# Patient Record
Sex: Male | Born: 1950 | Race: White | Hispanic: No | Smoking: Current every day smoker
Health system: Southern US, Community
[De-identification: ages and names within clinical notes are randomized; demographics above are authoritative.]

## PROBLEM LIST (undated history)

## (undated) DIAGNOSIS — E785 Hyperlipidemia, unspecified: Secondary | ICD-10-CM

## (undated) DIAGNOSIS — L02419 Cutaneous abscess of limb, unspecified: Secondary | ICD-10-CM

## (undated) DIAGNOSIS — N183 Chronic kidney disease, stage 3 unspecified: Secondary | ICD-10-CM

## (undated) DIAGNOSIS — R0609 Other forms of dyspnea: Secondary | ICD-10-CM

## (undated) DIAGNOSIS — L03119 Cellulitis of unspecified part of limb: Secondary | ICD-10-CM

## (undated) DIAGNOSIS — R778 Other specified abnormalities of plasma proteins: Secondary | ICD-10-CM

## (undated) DIAGNOSIS — I10 Essential (primary) hypertension: Secondary | ICD-10-CM

## (undated) DIAGNOSIS — M1990 Unspecified osteoarthritis, unspecified site: Secondary | ICD-10-CM

## (undated) DIAGNOSIS — R7989 Other specified abnormal findings of blood chemistry: Secondary | ICD-10-CM

## (undated) HISTORY — DX: Other specified abnormalities of plasma proteins: R77.8

## (undated) HISTORY — DX: Other specified abnormal findings of blood chemistry: R79.89

## (undated) HISTORY — PX: ANKLE FRACTURE SURGERY: SHX122

---

## 2000-01-22 ENCOUNTER — Emergency Department (HOSPITAL_COMMUNITY): Admission: EM | Admit: 2000-01-22 | Discharge: 2000-01-23 | Payer: Self-pay | Admitting: Emergency Medicine

## 2002-05-19 ENCOUNTER — Encounter: Payer: Self-pay | Admitting: Orthopedic Surgery

## 2002-05-19 ENCOUNTER — Inpatient Hospital Stay (HOSPITAL_COMMUNITY): Admission: EM | Admit: 2002-05-19 | Discharge: 2002-05-21 | Payer: Self-pay | Admitting: Emergency Medicine

## 2002-05-19 ENCOUNTER — Encounter: Payer: Self-pay | Admitting: Emergency Medicine

## 2002-06-06 ENCOUNTER — Ambulatory Visit (HOSPITAL_BASED_OUTPATIENT_CLINIC_OR_DEPARTMENT_OTHER): Admission: RE | Admit: 2002-06-06 | Discharge: 2002-06-07 | Payer: Self-pay | Admitting: Orthopedic Surgery

## 2006-04-10 ENCOUNTER — Encounter: Admission: RE | Admit: 2006-04-10 | Discharge: 2006-04-10 | Payer: Self-pay | Admitting: Orthopedic Surgery

## 2006-04-12 ENCOUNTER — Emergency Department (HOSPITAL_COMMUNITY): Admission: EM | Admit: 2006-04-12 | Discharge: 2006-04-12 | Payer: Self-pay | Admitting: Emergency Medicine

## 2009-10-28 ENCOUNTER — Emergency Department (HOSPITAL_COMMUNITY): Admission: EM | Admit: 2009-10-28 | Discharge: 2009-10-29 | Payer: Self-pay | Admitting: Emergency Medicine
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
## 2011-12-25 ENCOUNTER — Emergency Department (HOSPITAL_BASED_OUTPATIENT_CLINIC_OR_DEPARTMENT_OTHER)
Admission: EM | Admit: 2011-12-25 | Discharge: 2011-12-25 | Disposition: A | Discharge status: Home / Self Care | Payer: Self-pay | Attending: Emergency Medicine | Admitting: Emergency Medicine

## 2011-12-25 ENCOUNTER — Encounter (HOSPITAL_BASED_OUTPATIENT_CLINIC_OR_DEPARTMENT_OTHER): Payer: Self-pay

## 2011-12-25 DIAGNOSIS — F17200 Nicotine dependence, unspecified, uncomplicated: Secondary | ICD-10-CM | POA: Insufficient documentation

## 2011-12-25 DIAGNOSIS — N289 Disorder of kidney and ureter, unspecified: Secondary | ICD-10-CM | POA: Insufficient documentation

## 2011-12-25 DIAGNOSIS — I10 Essential (primary) hypertension: Secondary | ICD-10-CM | POA: Insufficient documentation

## 2011-12-25 DIAGNOSIS — E871 Hypo-osmolality and hyponatremia: Secondary | ICD-10-CM | POA: Insufficient documentation

## 2011-12-25 DIAGNOSIS — E119 Type 2 diabetes mellitus without complications: Secondary | ICD-10-CM | POA: Insufficient documentation

## 2011-12-25 HISTORY — DX: Essential (primary) hypertension: I10

## 2011-12-25 LAB — BASIC METABOLIC PANEL
BUN: 29 mg/dL — ABNORMAL HIGH (ref 6–23)
CO2: 25 mEq/L (ref 19–32)
Calcium: 8.6 mg/dL (ref 8.4–10.5)
Glucose, Bld: 92 mg/dL (ref 70–99)
Sodium: 120 mEq/L — ABNORMAL LOW (ref 135–145)

## 2011-12-25 LAB — URINALYSIS, ROUTINE W REFLEX MICROSCOPIC
Bilirubin Urine: NEGATIVE
Glucose, UA: NEGATIVE mg/dL
Ketones, ur: NEGATIVE mg/dL
pH: 5.5 (ref 5.0–8.0)

## 2011-12-25 LAB — CBC
MCH: 30.8 pg (ref 26.0–34.0)
MCHC: 36.1 g/dL — ABNORMAL HIGH (ref 30.0–36.0)
MCV: 85.4 fL (ref 78.0–100.0)
Platelets: 287 10*3/uL (ref 150–400)
RBC: 3.28 MIL/uL — ABNORMAL LOW (ref 4.22–5.81)

## 2011-12-25 NOTE — ED Notes (Signed)
Urinal provided.

## 2011-12-25 NOTE — Discharge Instructions (Signed)
Hyponatremia   Hyponatremia is when the amount of salt (sodium) in your blood is too low. When sodium levels are low, your cells will absorb extra water and swell. The swelling happens throughout the body, but it mostly affects the brain. Severe brain swelling (cerebral edema), seizures, or coma can happen.   CAUSES   Heart, kidney, or liver problems.   Thyroid problems.   Adrenal gland problems.   Severe vomiting and diarrhea.   Certain medicines or illegal drugs.   Dehydration.   Drinking too much water.   Low-sodium diet.   SYMPTOMS   Nausea and vomiting.   Confusion.   Lethargy.   Agitation.   Headache.   Twitching or shaking (seizures).   Unconsciousness.   Appetite loss.   Muscle weakness and cramping.   DIAGNOSIS   Hyponatremia is identified by a simple blood test. Your caregiver will perform a history and physical exam to try to find the cause and type of hyponatremia. Other tests may be needed to measure the amount of sodium in your blood and urine.   TREATMENT   Treatment will depend on the cause.   Fluids may be given through the vein (IV).   Medicines may be used to correct the sodium imbalance. If medicines are causing the problem, they will need to be adjusted.   Water or fluid intake may be restricted to restore proper balance.   The speed of correcting the sodium problem is very important. If the problem is corrected too fast, nerve damage (sometimes unchangeable) can happen.   HOME CARE INSTRUCTIONS   Only take medicines as directed by your caregiver. Many medicines can make hyponatremia worse. Discuss all your medicines with your caregiver.   Carefully follow any recommended diet, including any fluid restrictions.   You may be asked to repeat lab tests. Follow these directions.   Avoid alcohol and recreational drugs.   SEEK MEDICAL CARE IF:   You develop worsening nausea, fatigue, headache, confusion, or weakness.   Your original hyponatremia symptoms return.   You have problems following the  recommended diet.   SEEK IMMEDIATE MEDICAL CARE IF:   You have a seizure.   You faint.   You have ongoing diarrhea or vomiting.   MAKE SURE YOU:   Understand these instructions.   Will watch your condition.   Will get help right away if you are not doing well or get worse.   Document Released: 08/14/2002 Document Revised: 08/13/2011 Document Reviewed: 02/08/2011   [employer]® Patient Information ©2012 [employer].

## 2011-12-25 NOTE — ED Notes (Signed)
Secondary Assessment-  Pt reports he was seen by PMD earlier in the week and had labs collected.  He was called by office and informed NA was 119 and to go to ED for further evaluation.  Pt denies any symptoms and states he feels fine.

## 2011-12-25 NOTE — ED Notes (Signed)
Pt states that he had blood drawn by pcp and was told to come directly to ED because his sodium level was 119.

## 2011-12-25 NOTE — ED Provider Notes (Signed)
History     CSN: [id number]  Arrival date & time 12/25/11  31   First MD Initiated Contact with Patient 12/25/11 1815      Chief Complaint  Patient presents with  . Hyponatremia     (Consider location/radiation/quality/duration/timing/severity/associated sxs/prior treatment) HPI Comments: Pt was sent in by pcp for hyponatremia of 119 that pt had drawn a couple of days ago:pt states that he is not having an symptoms:pt states that he had eliminated sodium from his diet about 1 year ago:pt states that his bp has been better managed:pt denies dizziness, confusion, visual changes or other symptoms  The history is provided by the patient. No language interpreter was used.    Past Medical History  Diagnosis Date  . Hypertension   . Diabetes mellitus     Past Surgical History  Procedure Date  . Ankle fracture surgery     History reviewed. No pertinent family history.  History  Substance Use Topics  . Smoking status: Current Everyday Smoker -- 1.5 packs/day for 30 years    Types: Cigarettes  . Smokeless tobacco: Not on file  . Alcohol Use: No      Review of Systems  Constitutional: Negative.   Respiratory: Negative.   Cardiovascular: Negative.   Neurological: Negative.     Allergies  Aleve  Home Medications   Current Outpatient Rx  Name Route Sig Dispense Refill  . AMOXICILLIN 500 MG PO TABS Oral Take 500 mg by mouth 2 (two) times daily.    Marland Kitchen CLONAZEPAM 0.5 MG PO TABS Oral Take 0.5 mg by mouth 2 (two) times daily as needed.    . OMEGA-3 FATTY ACIDS 1000 MG PO CAPS Oral Take 2 g by mouth daily.    Marland Kitchen HYDROCHLOROTHIAZIDE 25 MG PO TABS Oral Take 25 mg by mouth daily.    Marland Kitchen LISINOPRIL 20 MG PO TABS Oral Take 20 mg by mouth daily.    . MELOXICAM 15 MG PO TABS Oral Take 15 mg by mouth daily.    Marland Kitchen METFORMIN HCL 500 MG PO TABS Oral Take 500 mg by mouth 2 (two) times daily with a meal.    . METOPROLOL SUCCINATE ER 100 MG PO TB24 Oral Take 100 mg by mouth daily. Take  with or immediately following a meal.    . ONE-DAILY MULTI VITAMINS PO TABS Oral Take 1 tablet by mouth daily.    Marland Kitchen PAROXETINE HCL 20 MG PO TABS Oral Take 20 mg by mouth every morning.    Marland Kitchen RANITIDINE HCL PO Oral Take 1 tablet by mouth daily as needed. Patient uses this medication for his allergies.      BP 148/75  Pulse 70  Temp(Src) 98.4 F (36.9 C) (Oral)  Resp 16  Ht 5\' 9"  (1.753 m)  Wt 214 lb (97.07 kg)  BMI 31.60 kg/m2  SpO2 100%  Physical Exam  Vitals reviewed. Constitutional: He is oriented to person, place, and time. He appears well-developed and well-nourished.  HENT:  Head: Normocephalic and atraumatic.  Eyes: Conjunctivae and EOM are normal.  Neck: Neck supple.  Cardiovascular: Normal rate and regular rhythm.   Pulmonary/Chest: Effort normal and breath sounds normal.  Musculoskeletal: Normal range of motion.  Neurological: He is alert and oriented to person, place, and time.  Skin: Skin is warm and dry.  Psychiatric: He has a normal mood and affect.    ED Course  Procedures (including critical care time)  Labs Reviewed  BASIC METABOLIC PANEL - Abnormal; Notable for the  following:    Sodium 120 (*)    Chloride 84 (*)    BUN 29 (*)    Creatinine, Ser 2.50 (*)    GFR calc non Af Amer 26 (*)    GFR calc Af Amer 31 (*)    All other components within normal limits  CBC - Abnormal; Notable for the following:    RBC 3.28 (*)    Hemoglobin 10.1 (*)    HCT 28.0 (*)    MCHC 36.1 (*)    All other components within normal limits  URINALYSIS, ROUTINE W REFLEX MICROSCOPIC   No results found.   1. Hyponatremia   2. Renal insufficiency       MDM  Discussed not eliminating sodium with pt and to limit his fluid intake and he is to follow up to have labs drawn in a week:Dr. Monia Pouch involved in the care of the pt        Glendell Docker, NP 12/25/11 1946

## 2011-12-26 NOTE — ED Provider Notes (Signed)
Medical screening examination/treatment/procedure(s) were conducted as a shared visit with non-physician practitioner(s) and myself.  I personally evaluated the patient during the encounter Pt with longstanding hypertension had been trying to control his hypertension by limiting sodium intake.  He had low sodium at his physician's office today and was sent to [facility] ED for evaluation.  He says that once or twice in the day his blood pressure will fall into the [id number] systolic and he will rest for an hour or two and recover; otherwise he is asymptomatic.  Exam shows him awake and alert, heart and lung clear, neurologically intact.  Lab tests show low sodium of 120 and mild renal insufficiency.  Advised to stop restricting sodium, limit fluid intake to 1000 to 1200 ml water to allow his sodium to come back up.  Advised him that he had some renal insufficiency now.  Mylinda Latina III, MD 12/26/11 1224

## 2016-09-21 DIAGNOSIS — Z8639 Personal history of other endocrine, nutritional and metabolic disease: Secondary | ICD-10-CM | POA: Insufficient documentation

## 2016-09-21 DIAGNOSIS — F411 Generalized anxiety disorder: Secondary | ICD-10-CM

## 2016-09-21 DIAGNOSIS — E1122 Type 2 diabetes mellitus with diabetic chronic kidney disease: Secondary | ICD-10-CM

## 2016-09-21 DIAGNOSIS — N184 Chronic kidney disease, stage 4 (severe): Secondary | ICD-10-CM | POA: Insufficient documentation

## 2016-09-21 DIAGNOSIS — I10 Essential (primary) hypertension: Secondary | ICD-10-CM

## 2016-09-21 DIAGNOSIS — F17200 Nicotine dependence, unspecified, uncomplicated: Secondary | ICD-10-CM

## 2016-09-21 DIAGNOSIS — M1990 Unspecified osteoarthritis, unspecified site: Secondary | ICD-10-CM

## 2016-09-21 HISTORY — DX: Nicotine dependence, unspecified, uncomplicated: F17.200

## 2016-09-21 HISTORY — DX: Essential (primary) hypertension: I10

## 2016-09-21 HISTORY — DX: Personal history of other endocrine, nutritional and metabolic disease: Z86.39

## 2016-09-21 HISTORY — DX: Unspecified osteoarthritis, unspecified site: M19.90

## 2016-09-21 HISTORY — DX: Generalized anxiety disorder: F41.1

## 2016-09-21 HISTORY — DX: Chronic kidney disease, stage 4 (severe): N18.4

## 2016-09-21 HISTORY — DX: Chronic kidney disease, stage 4 (severe): E11.22

## 2016-11-05 DIAGNOSIS — R0600 Dyspnea, unspecified: Secondary | ICD-10-CM

## 2016-11-05 DIAGNOSIS — L02419 Cutaneous abscess of limb, unspecified: Secondary | ICD-10-CM

## 2016-11-05 DIAGNOSIS — R0609 Other forms of dyspnea: Secondary | ICD-10-CM | POA: Insufficient documentation

## 2016-11-05 HISTORY — DX: Other forms of dyspnea: R06.09

## 2016-11-05 HISTORY — DX: Cutaneous abscess of limb, unspecified: L02.419

## 2016-11-05 HISTORY — DX: Dyspnea, unspecified: R06.00

## 2016-11-25 ENCOUNTER — Encounter (HOSPITAL_COMMUNITY): Payer: Self-pay

## 2016-11-25 ENCOUNTER — Inpatient Hospital Stay (HOSPITAL_COMMUNITY)
Admission: EM | Admit: 2016-11-25 | Discharge: 2016-11-30 | DRG: 871 | Disposition: A | Discharge status: Home / Self Care | Payer: Self-pay | Attending: Internal Medicine | Admitting: Internal Medicine

## 2016-11-25 ENCOUNTER — Emergency Department (HOSPITAL_COMMUNITY): Payer: Self-pay

## 2016-11-25 DIAGNOSIS — J9601 Acute respiratory failure with hypoxia: Secondary | ICD-10-CM | POA: Diagnosis present

## 2016-11-25 DIAGNOSIS — L299 Pruritus, unspecified: Secondary | ICD-10-CM | POA: Diagnosis present

## 2016-11-25 DIAGNOSIS — L03115 Cellulitis of right lower limb: Secondary | ICD-10-CM | POA: Diagnosis present

## 2016-11-25 DIAGNOSIS — E785 Hyperlipidemia, unspecified: Secondary | ICD-10-CM | POA: Diagnosis present

## 2016-11-25 DIAGNOSIS — F329 Major depressive disorder, single episode, unspecified: Secondary | ICD-10-CM | POA: Diagnosis present

## 2016-11-25 DIAGNOSIS — R000 Tachycardia, unspecified: Secondary | ICD-10-CM | POA: Diagnosis present

## 2016-11-25 DIAGNOSIS — R7989 Other specified abnormal findings of blood chemistry: Secondary | ICD-10-CM | POA: Diagnosis present

## 2016-11-25 DIAGNOSIS — Z720 Tobacco use: Secondary | ICD-10-CM | POA: Diagnosis present

## 2016-11-25 DIAGNOSIS — Z888 Allergy status to other drugs, medicaments and biological substances status: Secondary | ICD-10-CM

## 2016-11-25 DIAGNOSIS — L03116 Cellulitis of left lower limb: Secondary | ICD-10-CM | POA: Diagnosis present

## 2016-11-25 DIAGNOSIS — Z23 Encounter for immunization: Secondary | ICD-10-CM

## 2016-11-25 DIAGNOSIS — I130 Hypertensive heart and chronic kidney disease with heart failure and stage 1 through stage 4 chronic kidney disease, or unspecified chronic kidney disease: Secondary | ICD-10-CM | POA: Diagnosis present

## 2016-11-25 DIAGNOSIS — Z7982 Long term (current) use of aspirin: Secondary | ICD-10-CM

## 2016-11-25 DIAGNOSIS — R6520 Severe sepsis without septic shock: Secondary | ICD-10-CM

## 2016-11-25 DIAGNOSIS — E1122 Type 2 diabetes mellitus with diabetic chronic kidney disease: Secondary | ICD-10-CM | POA: Diagnosis present

## 2016-11-25 DIAGNOSIS — N183 Chronic kidney disease, stage 3 unspecified: Secondary | ICD-10-CM | POA: Diagnosis present

## 2016-11-25 DIAGNOSIS — F32A Depression, unspecified: Secondary | ICD-10-CM | POA: Diagnosis present

## 2016-11-25 DIAGNOSIS — L02419 Cutaneous abscess of limb, unspecified: Secondary | ICD-10-CM | POA: Diagnosis present

## 2016-11-25 DIAGNOSIS — Z91128 Patient's intentional underdosing of medication regimen for other reason: Secondary | ICD-10-CM

## 2016-11-25 DIAGNOSIS — R600 Localized edema: Secondary | ICD-10-CM

## 2016-11-25 DIAGNOSIS — I10 Essential (primary) hypertension: Secondary | ICD-10-CM | POA: Diagnosis present

## 2016-11-25 DIAGNOSIS — I248 Other forms of acute ischemic heart disease: Secondary | ICD-10-CM | POA: Diagnosis present

## 2016-11-25 DIAGNOSIS — R0602 Shortness of breath: Secondary | ICD-10-CM

## 2016-11-25 DIAGNOSIS — A409 Streptococcal sepsis, unspecified: Principal | ICD-10-CM | POA: Diagnosis present

## 2016-11-25 DIAGNOSIS — Z8249 Family history of ischemic heart disease and other diseases of the circulatory system: Secondary | ICD-10-CM

## 2016-11-25 DIAGNOSIS — R748 Abnormal levels of other serum enzymes: Secondary | ICD-10-CM

## 2016-11-25 DIAGNOSIS — I5031 Acute diastolic (congestive) heart failure: Secondary | ICD-10-CM | POA: Diagnosis present

## 2016-11-25 DIAGNOSIS — Z79899 Other long term (current) drug therapy: Secondary | ICD-10-CM

## 2016-11-25 DIAGNOSIS — R778 Other specified abnormalities of plasma proteins: Secondary | ICD-10-CM

## 2016-11-25 DIAGNOSIS — N179 Acute kidney failure, unspecified: Secondary | ICD-10-CM | POA: Diagnosis present

## 2016-11-25 DIAGNOSIS — M1990 Unspecified osteoarthritis, unspecified site: Secondary | ICD-10-CM | POA: Diagnosis present

## 2016-11-25 DIAGNOSIS — A419 Sepsis, unspecified organism: Secondary | ICD-10-CM

## 2016-11-25 DIAGNOSIS — Z6833 Body mass index (BMI) 33.0-33.9, adult: Secondary | ICD-10-CM

## 2016-11-25 DIAGNOSIS — F17210 Nicotine dependence, cigarettes, uncomplicated: Secondary | ICD-10-CM | POA: Diagnosis present

## 2016-11-25 DIAGNOSIS — D638 Anemia in other chronic diseases classified elsewhere: Secondary | ICD-10-CM | POA: Diagnosis present

## 2016-11-25 DIAGNOSIS — E119 Type 2 diabetes mellitus without complications: Secondary | ICD-10-CM

## 2016-11-25 DIAGNOSIS — R0609 Other forms of dyspnea: Secondary | ICD-10-CM

## 2016-11-25 DIAGNOSIS — I509 Heart failure, unspecified: Secondary | ICD-10-CM

## 2016-11-25 HISTORY — DX: Cutaneous abscess of limb, unspecified: L02.419

## 2016-11-25 HISTORY — DX: Cellulitis of unspecified part of limb: L03.119

## 2016-11-25 HISTORY — DX: Essential (primary) hypertension: I10

## 2016-11-25 HISTORY — DX: Hyperlipidemia, unspecified: E78.5

## 2016-11-25 HISTORY — DX: Other forms of dyspnea: R06.09

## 2016-11-25 HISTORY — DX: Unspecified osteoarthritis, unspecified site: M19.90

## 2016-11-25 HISTORY — DX: Chronic kidney disease, stage 3 unspecified: N18.30

## 2016-11-25 HISTORY — DX: Chronic kidney disease, stage 3 (moderate): N18.3

## 2016-11-25 LAB — URINALYSIS, ROUTINE W REFLEX MICROSCOPIC
Bacteria, UA: NONE SEEN
Bilirubin Urine: NEGATIVE
Glucose, UA: NEGATIVE mg/dL
Ketones, ur: 5 mg/dL — AB
Leukocytes, UA: NEGATIVE
Nitrite: NEGATIVE
Protein, ur: 100 mg/dL — AB
Specific Gravity, Urine: 1.015 (ref 1.005–1.030)
pH: 5 (ref 5.0–8.0)

## 2016-11-25 LAB — CBC WITH DIFFERENTIAL/PLATELET
BASOS ABS: 0 10*3/uL (ref 0.0–0.1)
BASOS PCT: 0 %
Eosinophils Absolute: 0 10*3/uL (ref 0.0–0.7)
Eosinophils Relative: 0 %
HEMATOCRIT: 32.3 % — AB (ref 39.0–52.0)
Hemoglobin: 10 g/dL — ABNORMAL LOW (ref 13.0–17.0)
LYMPHS PCT: 3 %
Lymphs Abs: 0.5 10*3/uL — ABNORMAL LOW (ref 0.7–4.0)
MCH: 27 pg (ref 26.0–34.0)
MCHC: 31 g/dL (ref 30.0–36.0)
MCV: 87.1 fL (ref 78.0–100.0)
MONOS PCT: 5 %
Monocytes Absolute: 0.9 10*3/uL (ref 0.1–1.0)
NEUTROS ABS: 15.3 10*3/uL — AB (ref 1.7–7.7)
NEUTROS PCT: 92 %
Platelets: 202 10*3/uL (ref 150–400)
RBC: 3.71 MIL/uL — ABNORMAL LOW (ref 4.22–5.81)
RDW: 17.3 % — ABNORMAL HIGH (ref 11.5–15.5)
WBC: 16.7 10*3/uL — ABNORMAL HIGH (ref 4.0–10.5)

## 2016-11-25 LAB — I-STAT CG4 LACTIC ACID, ED
LACTIC ACID, VENOUS: 2.52 mmol/L — AB (ref 0.5–1.9)
LACTIC ACID, VENOUS: 1.61 mmol/L (ref 0.5–1.9)

## 2016-11-25 LAB — BASIC METABOLIC PANEL
ANION GAP: 14 (ref 5–15)
BUN: 36 mg/dL — ABNORMAL HIGH (ref 6–20)
CHLORIDE: 100 mmol/L — AB (ref 101–111)
CO2: 18 mmol/L — AB (ref 22–32)
Calcium: 8.7 mg/dL — ABNORMAL LOW (ref 8.9–10.3)
Creatinine, Ser: 2.2 mg/dL — ABNORMAL HIGH (ref 0.61–1.24)
GFR calc non Af Amer: 30 mL/min — ABNORMAL LOW (ref >60)
GFR, EST AFRICAN AMERICAN: 34 mL/min — AB (ref >60)
Glucose, Bld: 160 mg/dL — ABNORMAL HIGH (ref 65–99)
POTASSIUM: 4.8 mmol/L (ref 3.5–5.1)
Sodium: 132 mmol/L — ABNORMAL LOW (ref 135–145)

## 2016-11-25 LAB — I-STAT TROPONIN, ED: Troponin i, poc: 0.54 ng/mL (ref 0.00–0.08)

## 2016-11-25 LAB — PROCALCITONIN: Procalcitonin: 6.08 ng/mL

## 2016-11-25 LAB — INFLUENZA PANEL BY PCR (TYPE A & B)
Influenza A By PCR: NEGATIVE
Influenza B By PCR: NEGATIVE

## 2016-11-25 LAB — TROPONIN I
Troponin I: 0.32 ng/mL (ref <0.03)
Troponin I: 0.4 ng/mL (ref <0.03)

## 2016-11-25 LAB — STREP PNEUMONIAE URINARY ANTIGEN: Strep Pneumo Urinary Antigen: POSITIVE — AB

## 2016-11-25 LAB — SEDIMENTATION RATE: Sed Rate: 41 mm/hr — ABNORMAL HIGH (ref 0–16)

## 2016-11-25 LAB — BRAIN NATRIURETIC PEPTIDE: B NATRIURETIC PEPTIDE 5: 1532.6 pg/mL — AB (ref 0.0–100.0)

## 2016-11-25 MED ORDER — SODIUM CHLORIDE 0.9% FLUSH
3.0000 mL | Freq: Two times a day (BID) | INTRAVENOUS | Status: DC
  Administered 2016-11-25 – 2016-11-29 (×8): 3 mL via INTRAVENOUS

## 2016-11-25 MED ORDER — SODIUM CHLORIDE 0.9 % IV SOLN: 1250.0000 mg | INTRAVENOUS | Status: DC

## 2016-11-25 MED ORDER — DEXTROSE 5 % IV SOLN
500.0000 mg | INTRAVENOUS | Status: DC
  Administered 2016-11-26: 500 mg via INTRAVENOUS
  Filled 2016-11-25: qty 500

## 2016-11-25 MED ORDER — NICOTINE 21 MG/24HR TD PT24
21.0000 mg | MEDICATED_PATCH | Freq: Once | TRANSDERMAL | Status: AC
  Administered 2016-11-25: 21 mg via TRANSDERMAL
  Filled 2016-11-25: qty 1

## 2016-11-25 MED ORDER — CLONAZEPAM 1 MG PO TABS
1.0000 mg | ORAL_TABLET | Freq: Two times a day (BID) | ORAL | Status: DC | PRN
  Administered 2016-11-25 – 2016-11-27 (×2): 1 mg via ORAL
  Filled 2016-11-25 (×3): qty 1

## 2016-11-25 MED ORDER — DEXTROSE 5 % IV SOLN
1.0000 g | INTRAVENOUS | Status: DC
  Filled 2016-11-25: qty 10

## 2016-11-25 MED ORDER — OMEGA-3 FATTY ACIDS 1000 MG PO CAPS: 2.0000 g | ORAL_CAPSULE | Freq: Every day | ORAL | Status: DC

## 2016-11-25 MED ORDER — METOPROLOL SUCCINATE ER 100 MG PO TB24
100.0000 mg | ORAL_TABLET | Freq: Two times a day (BID) | ORAL | Status: DC
  Administered 2016-11-25 – 2016-11-30 (×8): 100 mg via ORAL
  Filled 2016-11-25 (×10): qty 1

## 2016-11-25 MED ORDER — FUROSEMIDE 10 MG/ML IJ SOLN
80.0000 mg | Freq: Once | INTRAMUSCULAR | Status: AC
  Administered 2016-11-25: 80 mg via INTRAVENOUS
  Filled 2016-11-25: qty 8

## 2016-11-25 MED ORDER — ADULT MULTIVITAMIN W/MINERALS CH
1.0000 | ORAL_TABLET | Freq: Every day | ORAL | Status: DC
  Administered 2016-11-25 – 2016-11-30 (×6): 1 via ORAL
  Filled 2016-11-25 (×7): qty 1

## 2016-11-25 MED ORDER — ASPIRIN 81 MG PO CHEW
81.0000 mg | CHEWABLE_TABLET | Freq: Every day | ORAL | Status: DC
  Administered 2016-11-25 – 2016-11-30 (×6): 81 mg via ORAL
  Filled 2016-11-25 (×6): qty 1

## 2016-11-25 MED ORDER — LORATADINE 10 MG PO TABS
10.0000 mg | ORAL_TABLET | Freq: Every day | ORAL | Status: DC
  Administered 2016-11-25 – 2016-11-30 (×6): 10 mg via ORAL
  Filled 2016-11-25 (×6): qty 1

## 2016-11-25 MED ORDER — ONDANSETRON HCL 4 MG/2ML IJ SOLN: 4.0000 mg | Freq: Four times a day (QID) | INTRAMUSCULAR | Status: DC | PRN

## 2016-11-25 MED ORDER — CYCLOBENZAPRINE HCL 10 MG PO TABS
10.0000 mg | ORAL_TABLET | Freq: Once | ORAL | Status: AC
  Administered 2016-11-25: 10 mg via ORAL
  Filled 2016-11-25: qty 1

## 2016-11-25 MED ORDER — ACETAMINOPHEN 325 MG PO TABS
650.0000 mg | ORAL_TABLET | ORAL | Status: DC | PRN
  Administered 2016-11-25 – 2016-11-30 (×9): 650 mg via ORAL
  Filled 2016-11-25 (×10): qty 2

## 2016-11-25 MED ORDER — ENOXAPARIN SODIUM 60 MG/0.6ML ~~LOC~~ SOLN
50.0000 mg | SUBCUTANEOUS | Status: DC
  Administered 2016-11-25: 19:00:00 50 mg via SUBCUTANEOUS
  Filled 2016-11-25: qty 0.6

## 2016-11-25 MED ORDER — ASPIRIN 81 MG PO CHEW
324.0000 mg | CHEWABLE_TABLET | Freq: Once | ORAL | Status: AC
  Administered 2016-11-25: 324 mg via ORAL
  Filled 2016-11-25: qty 4

## 2016-11-25 MED ORDER — OMEGA-3-ACID ETHYL ESTERS 1 G PO CAPS
2.0000 g | ORAL_CAPSULE | Freq: Every day | ORAL | Status: DC
  Administered 2016-11-25 – 2016-11-30 (×6): 2 g via ORAL
  Filled 2016-11-25 (×6): qty 2

## 2016-11-25 MED ORDER — DEXTROSE 5 % IV SOLN: 2.0000 g | Freq: Once | INTRAVENOUS | Status: DC

## 2016-11-25 MED ORDER — DEXTROSE 5 % IV SOLN
2.0000 g | Freq: Once | INTRAVENOUS | Status: AC
  Administered 2016-11-25: 2 g via INTRAVENOUS
  Filled 2016-11-25 (×2): qty 2

## 2016-11-25 MED ORDER — PAROXETINE HCL 20 MG PO TABS
20.0000 mg | ORAL_TABLET | Freq: Every morning | ORAL | Status: DC
  Administered 2016-11-25 – 2016-11-30 (×6): 20 mg via ORAL
  Filled 2016-11-25 (×6): qty 1

## 2016-11-25 MED ORDER — ACETAMINOPHEN 500 MG PO TABS
500.0000 mg | ORAL_TABLET | Freq: Once | ORAL | Status: AC
  Administered 2016-11-25: 500 mg via ORAL
  Filled 2016-11-25: qty 1

## 2016-11-25 MED ORDER — SODIUM CHLORIDE 0.9% FLUSH: 3.0000 mL | INTRAVENOUS | Status: DC | PRN

## 2016-11-25 MED ORDER — SODIUM CHLORIDE 0.9 % IV SOLN
250.0000 mL | INTRAVENOUS | Status: DC | PRN
Start: 2016-11-25 — End: 2016-11-30

## 2016-11-25 MED ORDER — SODIUM CHLORIDE 0.9 % IV SOLN
2000.0000 mg | Freq: Once | INTRAVENOUS | Status: AC
  Administered 2016-11-25: 2000 mg via INTRAVENOUS
  Filled 2016-11-25: qty 2000

## 2016-11-25 MED ORDER — ATORVASTATIN CALCIUM 10 MG PO TABS
10.0000 mg | ORAL_TABLET | Freq: Every day | ORAL | Status: DC
  Administered 2016-11-25 – 2016-11-30 (×6): 10 mg via ORAL
  Filled 2016-11-25 (×6): qty 1

## 2016-11-25 NOTE — Progress Notes (Signed)
Pharmacy Antibiotic Note  Gilbert Holmes is a 66 y.o. male admitted on 11/25/2016 with cellulitis.  Pharmacy has been consulted for vancomycin and cefepime dosing. Tmax is 101.9, WBC is 16.7, lactic acid is elevated and SCr is elevated at 2.2.   Plan: Vancomycin 2gm IV x 1 then 1250mg  IV Q24H Cefepime 2gm IV Q24H F/u renal fxn, C&S, clinical status and trough at SS  Height: 5\' 9"  (175.3 cm) Weight: 235 lb (106.6 kg) IBW/kg (Calculated) : 70.7  Temp (24hrs), Avg:101.9 F (38.8 C), Min:101.9 F (38.8 C), Max:101.9 F (38.8 C)   Recent Labs Lab 11/25/16 1310 11/25/16 1320  WBC 16.7*  --   LATICACIDVEN  --  2.52*    CrCl cannot be calculated (Patient's most recent lab result is older than the maximum 21 days allowed.).    Allergies  Allergen Reactions  . Naproxen Sodium Swelling    Antimicrobials this admission: Vanc 3/21>> Cefepime 3/21>>  Dose adjustments this admission: N/A  Microbiology results: Pending  Thank you for allowing pharmacy to be a part of this patient's care.  Dex Blakely, Rande Lawman 11/25/2016 1:26 PM

## 2016-11-25 NOTE — Progress Notes (Signed)
Patient trasfered from ED to 5W24 via stretcher; alert and oriented x 4; no complaints of pain; IV saline locked in LAC.  Orient patient to room and unit;  gave patient care guide; instructed how to use the call bell and  fall risk precautions. Will continue to monitor the patient.

## 2016-11-25 NOTE — ED Notes (Signed)
Unsuccessful attempt at giving report to 5W. 

## 2016-11-25 NOTE — Progress Notes (Signed)
Patient reports some cramping in R hand and feet bilaterally. Also, reporting that he takes lotensin 10 mg po at night when he takes his lopressor Sent msg to NP:.5W 24 Holmes, Gilbert C/O cramping in R hand and feet.Is there anything to give? Also reports taking lotensin10mg  QHS with lopressor. Thank you. Awaiting call back.

## 2016-11-25 NOTE — ED Notes (Signed)
Placed patient on the monitor  the provider is in the there

## 2016-11-25 NOTE — ED Triage Notes (Signed)
Patient complains of increasing bilateral leg edema x 5 days, also with increased dyspnea and fever x 2 days. Today febrile and reports that he has been sleeping in car so he could sit up. Patient alert but rambles on and doesn't finish sentences. Lives alone. Denies CP,

## 2016-11-25 NOTE — ED Notes (Signed)
Patient still receiving vancomycin by infusion because patient keeps bending arm.. Patient made aware to keep arm straight.

## 2016-11-25 NOTE — H&P (Signed)
History and Physical    Gilbert Holmes BTD:[id number] DOB: December 26, 1950 DOA: 11/25/2016   PCP: [name], MD   Patient coming from/Resides with: Private residence  Admission status: Inpatient/telemetry -medically necessary to stay a minimum 2 midnights to rule out impending and/or unexpected changes in physiologic status that may differ from initial evaluation performed in the ER and/or at time of admission. Patient presents with dyspnea on exertion 24 hours, bilateral lower extremity edema for 1.5 weeks with cellulitic skin changes 1 week. Patient has underlying stage III chronic kidney disease and long-standing hypertension and current respiratory symptoms and edema are suggestive of likely new-onset heart failure. Patient will require telemetry monitoring, frequent nursing care with intake and output and daily weights, he will need an echocardiogram and we will need to follow labs closely. We'll tentatively give a 1 time dose of Lasix 80 mg IV but likely will require several days of aggressive IV diuresis pending echocardiogram results. Patient has high fever that may be related to lower extremity cellulitis-he has not had cough or chills but could have early influenza noting sick contacts with viral syndrome recently.  Chief Complaint: Lower extremity edema and dyspnea on exertion  HPI: Gilbert Holmes is a 66 y.o. male with medical history significant for hypertension, dyslipidemia, chronic kidney disease stage III, osteoarthritis, prior diagnosis of diabetes (see below). Patient reports he last saw his primary care provider 3 months ago with no new changes in medications were no new diagnoses. Patient reports that about 1.5 weeks ago he noticed progressive swelling in both of his legs. Was associated with significant pruritus therefore he began to scratch at his legs excessively. Over the past week he has noticed redness in both legs. He reports that yesterday on 3/20 he noticed increasing  shortness of breath primarily dyspnea on exertion and orthopnea. The orthopnea was significant enough that he cannot get comfortable and his home either in his bed chair or couch. He went out to sit in his car and felt comfortable so he obtained appropriate blankets and coats to keep himself warm and was able to sleep in the car Monday night and Tuesday night. Because of these symptoms he presented to the ER for treatment. Patient denies that he is homeless and living out of his car. He tells me that he did not had any fevers until he presented to the ER. Denies chest pain. He denies cough or other upper respiratory infection symptoms.  ED Course:  Vital Signs: BP (!) 142/62   Pulse (!) 112   Temp (!) 102.8 F (39.3 C) (Oral)   Resp 19   Ht 5' 9"  (1.753 m)   Wt 106.6 kg (235 lb)   SpO2 92%   BMI 34.70 kg/m  PCXR: Marked cardiac enlargement without evidence of congestive heart failure although small bilateral pleural effusions are seen there is also bibasilar atelectasis-is question of a retrocardiac process with two-view chest x-ray recommended when stable Lab data: Sodium 132, potassium 4.8, chloride 100, CO2 18, glucose 160, BUN 36, creatinine 2.2, calcium 8.7, anion gap 14, BNP 1533, POC troponin 0.54, lactic acid 2.52 with repeat 1.61, white count 16,700 with neutrophils 92% and absolute neutrophils 15.3%, hemoglobin 10.0, platelets 202,000, urinalysis with hazy appearance, moderate hemoglobin, 100 protein, 5 ketones and 0-5 wbc's; blood cultures 2, urine culture in ER Medications and treatments: Vancomycin 2 g IV 1, Maxipime 2 g IV 1, aspirin tab 324 mg 1, Tylenol tablet 500 mg 1, nicotine patch 21 mg 1  Review of Systems:  In addition to the HPI above,  No chills, myalgias or other constitutional symptoms No Headache, changes with Vision or hearing, new weakness, tingling, numbness in any extremity, dizziness, dysarthria or word finding difficulty, gait disturbance or imbalance,  tremors or seizure activity No problems swallowing food or Liquids, indigestion/reflux, choking or coughing while eating, abdominal pain with or after eating No Chest pain, Cough or palpitations No Abdominal pain, N/V, melena,hematochezia, dark tarry stools, constipation No dysuria, malodorous urine, hematuria or flank pain No new skin rashes, lesions, masses or bruises, No new joint pains, aches, swelling or redness No recent unintentional weight gain or loss No polyuria, polydypsia or polyphagia   Past Medical History:  Diagnosis Date  . Arthritis   . Diabetes mellitus   . Hypertension     Past Surgical History:  Procedure Laterality Date  . ANKLE FRACTURE SURGERY      Social History   Social History  . Marital status: Divorced    Spouse name: N/A  . Number of children: N/A  . Years of education: N/A   Occupational History  . Not on file.   Social History Main Topics  . Smoking status: Current Every Day Smoker    Packs/day: 1.50    Years: 30.00    Types: Cigarettes  . Smokeless tobacco: Never Used  . Alcohol use No  . Drug use: No  . Sexual activity: Not on file   Other Topics Concern  . Not on file   Social History Narrative  . No narrative on file    Mobility: Utilizes a cane rarely Work history: Retired   Allergies  Allergen Reactions  . Naproxen Sodium Swelling    Family history reviewed and not pertinentTo current admission findings are diagnosis   Prior to Admission medications   Medication Sig Start Date End Date Taking? Authorizing Provider  aspirin 81 MG chewable tablet Chew 81 mg by mouth daily.   Yes Historical Provider, MD  atorvastatin (LIPITOR) 10 MG tablet Take 10 mg by mouth daily.   Yes Historical Provider, MD  benazepril (LOTENSIN) 10 MG tablet Take 10 mg by mouth daily.   Yes Historical Provider, MD  clonazePAM (KLONOPIN) 0.5 MG tablet Take 1 mg by mouth 2 (two) times daily as needed for anxiety.    Yes Historical Provider, MD    fish oil-omega-3 fatty acids 1000 MG capsule Take 2 g by mouth daily.   Yes Historical Provider, MD  loratadine (CLARITIN) 10 MG tablet Take 10 mg by mouth daily.   Yes Historical Provider, MD  metoprolol succinate (TOPROL-XL) 100 MG 24 hr tablet Take 100 mg by mouth 2 (two) times daily. Take with or immediately following a meal.    Yes Historical Provider, MD  Multiple Vitamin (MULTIVITAMIN) tablet Take 1 tablet by mouth daily.   Yes Historical Provider, MD  PARoxetine (PAXIL) 20 MG tablet Take 20 mg by mouth every morning.   Yes Historical Provider, MD    Physical Exam: Vitals:   11/25/16 1330 11/25/16 1357 11/25/16 1400 11/25/16 1430  BP: (!) 165/95  (!) 165/98 (!) 142/62  Pulse: (!) 114  (!) 122 (!) 112  Resp: (!) 21  (!) 27 19  Temp:  (!) 102.8 F (39.3 C)    TempSrc:  Oral    SpO2: 94%  96% 92%  Weight:      Height:          Constitutional: NAD, calm, comfortable-Unkempt appearance overall Eyes: PERRL, lids and  conjunctivae normal ENMT: Mucous membranes are moist. Posterior pharynx clear of any exudate or lesions.Poor dentition.  Neck: normal, supple, no masses, no thyromegaly Respiratory: Bilateral expiratory crackles, lung sounds decreased right base, Normal respiratory effort without o accessory muscle use at rest Cardiovascular: Regular mildly tachycardic rate and rhythm, no murmurs / rubs / gallops. 2-3+ bilateral lower extremity edema. 2+ pedal pulses. No carotid bruits. 6 cm JVD bilaterally. Abdomen: no tenderness, no masses palpated. No hepatosplenomegaly. Bowel sounds positive.  Musculoskeletal: no clubbing / cyanosis. No joint deformity upper and lower extremities. Good ROM, no contractures. Normal muscle tone.  Skin: Diffuse fluent cellulitic skin changes involving the feet which extends to just above the knees -multiple excoriations from patient reports pruritis Neurologic: CN 2-12 grossly intact. Sensation intact, DTR normal. Strength 5/5 x all 4 extremities.   Psychiatric: Normal judgment and insight. Alert and oriented x 3. Normal mood.    Labs on Admission: I have personally reviewed following labs and imaging studies  CBC:  Recent Labs Lab 11/25/16 1310  WBC 16.7*  NEUTROABS 15.3*  HGB 10.0*  HCT 32.3*  MCV 87.1  PLT 834   Basic Metabolic Panel:  Recent Labs Lab 11/25/16 1310  NA 132*  K 4.8  CL 100*  CO2 18*  GLUCOSE 160*  BUN 36*  CREATININE 2.20*  CALCIUM 8.7*   GFR: Estimated Creatinine Clearance: 40.3 mL/min (A) (by C-G formula based on SCr of 2.2 mg/dL (H)). Liver Function Tests: No results for input(s): AST, ALT, ALKPHOS, BILITOT, PROT, ALBUMIN in the last 168 hours. No results for input(s): LIPASE, AMYLASE in the last 168 hours. No results for input(s): AMMONIA in the last 168 hours. Coagulation Profile: No results for input(s): INR, PROTIME in the last 168 hours. Cardiac Enzymes: No results for input(s): CKTOTAL, CKMB, CKMBINDEX, TROPONINI in the last 168 hours. BNP (last 3 results) No results for input(s): PROBNP in the last 8760 hours. HbA1C: No results for input(s): HGBA1C in the last 72 hours. CBG: No results for input(s): GLUCAP in the last 168 hours. Lipid Profile: No results for input(s): CHOL, HDL, LDLCALC, TRIG, CHOLHDL, LDLDIRECT in the last 72 hours. Thyroid Function Tests: No results for input(s): TSH, T4TOTAL, FREET4, T3FREE, THYROIDAB in the last 72 hours. Anemia Panel: No results for input(s): VITAMINB12, FOLATE, FERRITIN, TIBC, IRON, RETICCTPCT in the last 72 hours. Urine analysis:    Component Value Date/Time   COLORURINE YELLOW 11/25/2016 1447   APPEARANCEUR HAZY (A) 11/25/2016 1447   LABSPEC 1.015 11/25/2016 1447   PHURINE 5.0 11/25/2016 1447   GLUCOSEU NEGATIVE 11/25/2016 1447   HGBUR MODERATE (A) 11/25/2016 1447   BILIRUBINUR NEGATIVE 11/25/2016 1447   KETONESUR 5 (A) 11/25/2016 1447   PROTEINUR 100 (A) 11/25/2016 1447   UROBILINOGEN 0.2 12/25/2011 1850   NITRITE NEGATIVE  11/25/2016 1447   LEUKOCYTESUR NEGATIVE 11/25/2016 1447   Sepsis Labs: @LABRCNTIP (procalcitonin:4,lacticidven:4) )No results found for this or any previous visit (from the past 240 hour(s)).   Radiological Exams on Admission: Dg Chest Portable 1 View  Result Date: 11/25/2016 CLINICAL DATA:  Shortness of breath and swelling of the legs for 1 month. EXAM: PORTABLE CHEST 1 VIEW COMPARISON:  None. FINDINGS: The heart is markedly enlarged. Bibasilar subsegmental atelectasis is present. There is prominence of the aortic knob. Small effusions are suspected. There does not appear to be frank edema although asymmetric retrocardiac opacity could reflect LEFT lower lobe process, versus simply opacity due to the enlarged heart shadow. There is no pneumothorax. No osseous findings.  IMPRESSION: Marked cardiac enlargement. No definite congestive failure although small BILATERAL effusions are seen. Bibasilar atelectasis. Query retrocardiac process. Recommend two-view chest when stable. Electronically Signed   By: Staci Righter M.D.   On: 11/25/2016 12:52    EKG: (Independently reviewed) Sinus tachycardia with ventricular rate 117 bpm, QTC 451 ms, no ST segment or T-wave changes that would be concerning for Ischemia  Assessment/Plan Principal Problem:   DOE (dyspnea on exertion)/cardiomegaly/bilateral lower extremity edema -Patient presents with progressive lower extremity swelling with new onset dyspnea on exertion and orthopnea in setting of chronic kidney disease and known hypertension suspicious for new onset congestive heart failure -BNP elevated greater than 1500 -poc troponin also mildly elevated at 0.5 suggestive of demand ischemia noting patient without reports of chest pain and EKG not consistent with acute ischemia -Lasix 80 mg IV 1; anticipate will require twice a day dosing for several days to achieve adequate diuresis -Echocardiogram-no prior for comparison -Underlying chronic kidney disease and  with need to aggressively diurese we'll hold home ACE inhibitor for now -Continue Toprol -Daily weights and strict I/O -Cycle troponin given mild elevation -Pending echocardiogram results may require formal cardiology consultation  Active Problems:   SIRS (systemic inflammatory response syndrome)  -Presents with fever 102.8 and associated tachycardia, mild tachypnea without hypotension or worsening of known chronic kidney disease; initial lactic acid elevated at 2.52 and quickly decreased to 1.61 with treatments initiated in the ER -Etiology likely lower extremity cellulitis although chest x-ray question possible retrocardiac opacity therefore obtain two-view chest x-ray in a.m. -Given severity of CKD and no likelihood this is a healthcare acquired infection we'll narrow antibiotics to Rocephin and Zithromax first doses tomorrow -Follow up on blood cultures -Urinalysis unremarkable culture was obtained -Procalcitonin and ESR -Influenza PCR      Cellulitis and abscess of leg -See above    HTN (hypertension) -Marked hypertension at presentation with a reading of 165/140 which has now normalized -ACE inhibitor on hold as above -Continue Toprol    CKD (chronic kidney disease) stage 3, GFR 30-59 ml/min -Renal function in 2013: 29 and 2.5 -Current renal function: 36 and 2.2 -Follow electrolytes closely with aggressive diuresis    ?? Diet-controlled diabetes mellitus  -Patient reportedly previously was on glimepiride but hemoglobin A1c consistently below 6 and PCP took him off oral agents and told him she did not think he had diabetes -Current CBG mildly elevated -Check hemoglobin A1c    HLD (hyperlipidemia) -Continue Lipitor and omega-3 fatty acids    Obesity (BMI 30.0-34.9)    Osteoarthritis -Utilizes infrequent dosing of Ultram at home      DVT prophylaxis: Lovenox Code Status: Full Family Communication: No friends or family at bedside Disposition Plan: Home Consults  called: None    Nasser Ku L. ANP-BC [facility] Pager [phone]   If 7PM-7AM, please contact night-coverage [email or web address] Password [facility]  11/25/2016, 4:01 PM

## 2016-11-25 NOTE — ED Notes (Signed)
Paged Dr. Marily Memos due to patient's temp of 103.0

## 2016-11-25 NOTE — ED Notes (Signed)
Patient given urinal to urinate in at bedside.

## 2016-11-25 NOTE — ED Provider Notes (Signed)
[facility] DEPT Provider Note   CSN: [id number] Arrival date & time: 11/25/16  1220     History   Chief Complaint Chief Complaint  Patient presents with  . shortness of breath/ edema    HPI Gilbert Holmes is a 66 y.o. male wPMHX DM and HTN who presents to ED today with chief complaint DOE and BLE edema. He states edema began 2 weeks ago and has been progressively worsening, with dyspnea developing this week, which improves at rest. Also endorses orthopnea and states he has been sleeping in his car for the past 5 days to sleep upright. Denies PND, crushing chest pain, diaphoresis.  BLE edema is not painful, but "itchy" and he has been scratching at it. No numbness, tingling, weakness. Also states he has not had any of his medications for 2 days due to decreased appetite. Denies subjective fever/chills, CP, dizziness, syncope, HA, abd pain, n/v/d, hematuria, dysuria, melena, hematochezia. Also denies previous hx of DVT/PE, hemoptysis, estrogen/testosterone therapy, and recent surgery/travel. He has been a smoker for >40 years and presently smokes 0.5 ppd. Has been taking his BG at home which has been running between 100-120.  The history is provided by the patient and a relative.    Past Medical History:  Diagnosis Date  . Arthritis   . Diabetes mellitus   . Hypertension     Patient Active Problem List   Diagnosis Date Noted  . DOE (dyspnea on exertion) 11/25/2016  . Fever 11/25/2016  . Cardiomegaly 11/25/2016  . Bilateral lower extremity edema 11/25/2016  . Cellulitis and abscess of leg 11/25/2016  . HTN (hypertension) 11/25/2016  . HLD (hyperlipidemia) 11/25/2016  . CKD (chronic kidney disease) stage 3, GFR 30-59 ml/min 11/25/2016  . Diet-controlled diabetes mellitus ([place]) 11/25/2016  . Obesity (BMI 30.0-34.9) 11/25/2016    Past Surgical History:  Procedure Laterality Date  . ANKLE FRACTURE SURGERY         Home Medications    Prior to Admission medications     Medication Sig Start Date End Date Taking? Authorizing Provider  aspirin 81 MG chewable tablet Chew 81 mg by mouth daily.   Yes Historical Provider, MD  atorvastatin (LIPITOR) 10 MG tablet Take 10 mg by mouth daily.   Yes Historical Provider, MD  benazepril (LOTENSIN) 10 MG tablet Take 10 mg by mouth daily.   Yes Historical Provider, MD  clonazePAM (KLONOPIN) 0.5 MG tablet Take 1 mg by mouth 2 (two) times daily as needed for anxiety.    Yes Historical Provider, MD  fish oil-omega-3 fatty acids 1000 MG capsule Take 2 g by mouth daily.   Yes Historical Provider, MD  loratadine (CLARITIN) 10 MG tablet Take 10 mg by mouth daily.   Yes Historical Provider, MD  metoprolol succinate (TOPROL-XL) 100 MG 24 hr tablet Take 100 mg by mouth 2 (two) times daily. Take with or immediately following a meal.    Yes Historical Provider, MD  Multiple Vitamin (MULTIVITAMIN) tablet Take 1 tablet by mouth daily.   Yes Historical Provider, MD  PARoxetine (PAXIL) 20 MG tablet Take 20 mg by mouth every morning.   Yes Historical Provider, MD    Family History No family history on file.  Social History Social History  Substance Use Topics  . Smoking status: Current Every Day Smoker    Packs/day: 1.50    Years: 30.00    Types: Cigarettes  . Smokeless tobacco: Never Used  . Alcohol use No     Allergies   Naproxen  sodium   Review of Systems Review of Systems  Constitutional: Positive for appetite change. Negative for chills, diaphoresis and fever.  HENT: Negative for congestion and sneezing.   Respiratory: Positive for shortness of breath. Negative for cough.   Cardiovascular: Positive for leg swelling. Negative for chest pain and palpitations.  Gastrointestinal: Negative for abdominal distention, abdominal pain, constipation, diarrhea, nausea and vomiting.  Genitourinary: Negative for dysuria and hematuria.  Musculoskeletal: Negative for arthralgias and back pain.  Skin: Positive for rash.   Neurological: Negative for dizziness, syncope, numbness and headaches.     Physical Exam Updated Vital Signs BP (!) 142/62   Pulse (!) 112   Temp (!) 102.8 F (39.3 C) (Oral)   Resp 19   Ht 5\' 9"  (1.753 m)   Wt 106.6 kg   SpO2 92%   BMI 34.70 kg/m   Physical Exam  Constitutional: He is oriented to person, place, and time. He appears well-developed and well-nourished. No distress.  HENT:  Head: Normocephalic and atraumatic.  Eyes: Conjunctivae are normal. Right eye exhibits no discharge. Left eye exhibits no discharge. No scleral icterus.  Neck: No JVD present. No tracheal deviation present. No thyromegaly present.  Cardiovascular: Regular rhythm, normal heart sounds and intact distal pulses.  Exam reveals no gallop and no friction rub.   No murmur heard. Tachycardic, 2+ DP/PT pulses BLE  Pulmonary/Chest:  left sided basilar rales, otherwise diminished breath sounds, no wheezing  Abdominal: Soft. He exhibits no distension. There is no tenderness.  Musculoskeletal: He exhibits edema.  Neurological: He is alert and oriented to person, place, and time.  Skin: Skin is warm. He is not diaphoretic. There is erythema.  BLE 2+ edema. LE erythematous, warm, weeping serous fluid. Excoriations and scabs present throughout lower extremities. Raised annular satelite lesions appear to extend to upper thighs b/l  Psychiatric: He has a normal mood and affect. His behavior is normal. Judgment and thought content normal.     ED Treatments / Results  Labs (all labs ordered are listed, but only abnormal results are displayed) Labs Reviewed  CBC WITH DIFFERENTIAL/PLATELET - Abnormal; Notable for the following:       Result Value   WBC 16.7 (*)    RBC 3.71 (*)    Hemoglobin 10.0 (*)    HCT 32.3 (*)    RDW 17.3 (*)    Neutro Abs 15.3 (*)    Lymphs Abs 0.5 (*)    All other components within normal limits  BASIC METABOLIC PANEL - Abnormal; Notable for the following:    Sodium 132 (*)     Chloride 100 (*)    CO2 18 (*)    Glucose, Bld 160 (*)    BUN 36 (*)    Creatinine, Ser 2.20 (*)    Calcium 8.7 (*)    GFR calc non Af Amer 30 (*)    GFR calc Af Amer 34 (*)    All other components within normal limits  BRAIN NATRIURETIC PEPTIDE - Abnormal; Notable for the following:    B Natriuretic Peptide 1,532.6 (*)    All other components within normal limits  URINALYSIS, ROUTINE W REFLEX MICROSCOPIC - Abnormal; Notable for the following:    APPearance HAZY (*)    Hgb urine dipstick MODERATE (*)    Ketones, ur 5 (*)    Protein, ur 100 (*)    Squamous Epithelial / LPF 0-5 (*)    All other components within normal limits  I-STAT TROPOININ, ED - Abnormal;  Notable for the following:    Troponin i, poc 0.54 (*)    All other components within normal limits  I-STAT CG4 LACTIC ACID, ED - Abnormal; Notable for the following:    Lactic Acid, Venous 2.52 (*)    All other components within normal limits  URINE CULTURE  CULTURE, BLOOD (ROUTINE X 2)  CULTURE, BLOOD (ROUTINE X 2)  INFLUENZA PANEL BY PCR (TYPE A & B)  TROPONIN I  TROPONIN I  TROPONIN I  PROCALCITONIN  SEDIMENTATION RATE  I-STAT CG4 LACTIC ACID, ED    EKG  EKG Interpretation  Date/Time:  Wednesday November 25 2016 12:27:24 EDT Ventricular Rate:  117 PR Interval:  142 QRS Duration: 78 QT Interval:  324 QTC Calculation: 451 R Axis:   52 Text Interpretation:  Sinus tachycardia Nonspecific ST and T wave abnormality Abnormal ECG Confirmed by Alvino Chapel  MD, Ovid Curd [phone]) on 11/25/2016 12:48:21 PM Also confirmed by Alvino Chapel  MD, NATHAN [phone]), editor Lorenda Cahill CT, Leda Gauze [phone])  on 11/25/2016 12:56:36 PM       Radiology Dg Chest Portable 1 View  Result Date: 11/25/2016 CLINICAL DATA:  Shortness of breath and swelling of the legs for 1 month. EXAM: PORTABLE CHEST 1 VIEW COMPARISON:  None. FINDINGS: The heart is markedly enlarged. Bibasilar subsegmental atelectasis is present. There is prominence of the aortic knob.  Small effusions are suspected. There does not appear to be frank edema although asymmetric retrocardiac opacity could reflect LEFT lower lobe process, versus simply opacity due to the enlarged heart shadow. There is no pneumothorax. No osseous findings. IMPRESSION: Marked cardiac enlargement. No definite congestive failure although small BILATERAL effusions are seen. Bibasilar atelectasis. Query retrocardiac process. Recommend two-view chest when stable. Electronically Signed   By: Staci Righter M.D.   On: 11/25/2016 12:52    Procedures Procedures (including critical care time)  Medications Ordered in ED Medications  vancomycin (VANCOCIN) 2,000 mg in sodium chloride 0.9 % 500 mL IVPB (2,000 mg Intravenous New Bag/Given 11/25/16 1443)  ceFEPIme (MAXIPIME) 2 g in dextrose 5 % 50 mL IVPB (not administered)  vancomycin (VANCOCIN) 1,250 mg in sodium chloride 0.9 % 250 mL IVPB (not administered)  nicotine (NICODERM CQ - dosed in mg/24 hours) patch 21 mg (21 mg Transdermal Patch Applied 11/25/16 1434)  furosemide (LASIX) injection 80 mg (not administered)  ceFEPIme (MAXIPIME) 2 g in dextrose 5 % 50 mL IVPB (0 g Intravenous Stopped 11/25/16 1443)  aspirin chewable tablet 324 mg (324 mg Oral Given 11/25/16 1354)  acetaminophen (TYLENOL) tablet 500 mg (500 mg Oral Given 11/25/16 1354)     Initial Impression / Assessment and Plan / ED Course  I have reviewed the triage vital signs and the nursing notes.  Pertinent labs & imaging results that were available during my care of the patient were reviewed by me and considered in my medical decision making (see chart for details).     65yom w/pmhx DM and HTN presents to ED today with progressively worsening BLE edema and 1 week of DOE. Pt febrile, tachycardic, and hypertensive in ED. A&Ox4. BLE 2+ edema and weeping serious fluid. CBC shows WBC 16.7, chronic anemia, elevated lactate at 2.52, and positive initial troponin of 0.54 and BNP elevated at 1532. EKG shows  sinus tachycardia, without evidence of STEMI. Likely acute CHF complicated by possible cellulitis of BLE. Began broad spectrum coverage with vancomycin and cefepime, tylenol for fever, and ASA 325mg . Blood and urine cultures pending. Discussed with hospitalist, they will bring pt in  to the hospital for further management.   Final Clinical Impressions(s) / ED Diagnoses   Final diagnoses:  Acute congestive heart failure, unspecified congestive heart failure type ([place])  Elevated troponin  Shortness of breath    New Prescriptions New Prescriptions   No medications on file     Renita Papa, [place] 11/25/16 1555    Davonna Belling, MD 11/28/16 [phone]

## 2016-11-25 NOTE — ED Notes (Signed)
Two unsuccessful attempts at getting IV access.

## 2016-11-25 NOTE — ED Notes (Signed)
Attempted lab draws x2

## 2016-11-26 ENCOUNTER — Encounter (HOSPITAL_COMMUNITY): Payer: Self-pay | Admitting: Student

## 2016-11-26 ENCOUNTER — Inpatient Hospital Stay (HOSPITAL_COMMUNITY): Payer: Self-pay

## 2016-11-26 DIAGNOSIS — J9601 Acute respiratory failure with hypoxia: Secondary | ICD-10-CM

## 2016-11-26 DIAGNOSIS — I517 Cardiomegaly: Secondary | ICD-10-CM

## 2016-11-26 DIAGNOSIS — N170 Acute kidney failure with tubular necrosis: Secondary | ICD-10-CM

## 2016-11-26 DIAGNOSIS — I5031 Acute diastolic (congestive) heart failure: Secondary | ICD-10-CM

## 2016-11-26 DIAGNOSIS — E785 Hyperlipidemia, unspecified: Secondary | ICD-10-CM

## 2016-11-26 LAB — HIV ANTIBODY (ROUTINE TESTING W REFLEX): HIV SCREEN 4TH GENERATION: NONREACTIVE

## 2016-11-26 LAB — BLOOD CULTURE ID PANEL (REFLEXED)

## 2016-11-26 LAB — COMPREHENSIVE METABOLIC PANEL
ALK PHOS: 41 U/L (ref 38–126)
ALT: 30 U/L (ref 17–63)
AST: 30 U/L (ref 15–41)
Albumin: 2.4 g/dL — ABNORMAL LOW (ref 3.5–5.0)
Anion gap: 12 (ref 5–15)
BILIRUBIN TOTAL: 1 mg/dL (ref 0.3–1.2)
BUN: 53 mg/dL — AB (ref 6–20)
CALCIUM: 8 mg/dL — AB (ref 8.9–10.3)
CO2: 22 mmol/L (ref 22–32)
CREATININE: 3 mg/dL — AB (ref 0.61–1.24)
Chloride: 100 mmol/L — ABNORMAL LOW (ref 101–111)
GFR calc Af Amer: 24 mL/min — ABNORMAL LOW (ref >60)
GFR calc non Af Amer: 20 mL/min — ABNORMAL LOW (ref >60)
GLUCOSE: 154 mg/dL — AB (ref 65–99)
Potassium: 3.8 mmol/L (ref 3.5–5.1)
Sodium: 134 mmol/L — ABNORMAL LOW (ref 135–145)
TOTAL PROTEIN: 5.4 g/dL — AB (ref 6.5–8.1)

## 2016-11-26 LAB — CBC WITH DIFFERENTIAL/PLATELET
BASOS ABS: 0 10*3/uL (ref 0.0–0.1)
Basophils Relative: 0 %
EOS PCT: 3 %
Eosinophils Absolute: 0.4 10*3/uL (ref 0.0–0.7)
HCT: 30 % — ABNORMAL LOW (ref 39.0–52.0)
Hemoglobin: 9.6 g/dL — ABNORMAL LOW (ref 13.0–17.0)
LYMPHS ABS: 1.2 10*3/uL (ref 0.7–4.0)
LYMPHS PCT: 10 %
MCH: 27.5 pg (ref 26.0–34.0)
MCHC: 32 g/dL (ref 30.0–36.0)
MCV: 86 fL (ref 78.0–100.0)
MONOS PCT: 6 %
Monocytes Absolute: 0.8 10*3/uL (ref 0.1–1.0)
Neutro Abs: 10 10*3/uL — ABNORMAL HIGH (ref 1.7–7.7)
Neutrophils Relative %: 81 %
PLATELETS: 179 10*3/uL (ref 150–400)
RBC: 3.49 MIL/uL — ABNORMAL LOW (ref 4.22–5.81)
RDW: 17.6 % — AB (ref 11.5–15.5)
WBC: 12.4 10*3/uL — AB (ref 4.0–10.5)

## 2016-11-26 LAB — BASIC METABOLIC PANEL
Anion gap: 12 (ref 5–15)
BUN: 47 mg/dL — AB (ref 6–20)
CALCIUM: 8.2 mg/dL — AB (ref 8.9–10.3)
CO2: 21 mmol/L — AB (ref 22–32)
CREATININE: 2.91 mg/dL — AB (ref 0.61–1.24)
Chloride: 99 mmol/L — ABNORMAL LOW (ref 101–111)
GFR calc Af Amer: 25 mL/min — ABNORMAL LOW (ref >60)
GFR, EST NON AFRICAN AMERICAN: 21 mL/min — AB (ref >60)
GLUCOSE: 147 mg/dL — AB (ref 65–99)
Potassium: 4.2 mmol/L (ref 3.5–5.1)
Sodium: 132 mmol/L — ABNORMAL LOW (ref 135–145)

## 2016-11-26 LAB — TROPONIN I: TROPONIN I: 0.51 ng/mL — AB (ref <0.03)

## 2016-11-26 LAB — URINE CULTURE: Culture: <10000 — AB

## 2016-11-26 LAB — ECHOCARDIOGRAM COMPLETE
Height: 69 in
Weight: 3615.54 oz

## 2016-11-26 MED ORDER — NICOTINE 14 MG/24HR TD PT24
14.0000 mg | MEDICATED_PATCH | Freq: Every day | TRANSDERMAL | Status: DC
  Administered 2016-11-26 – 2016-11-30 (×5): 14 mg via TRANSDERMAL
  Filled 2016-11-26 (×5): qty 1

## 2016-11-26 MED ORDER — CYCLOBENZAPRINE HCL 5 MG PO TABS
5.0000 mg | ORAL_TABLET | Freq: Three times a day (TID) | ORAL | Status: DC
  Administered 2016-11-26 – 2016-11-30 (×12): 5 mg via ORAL
  Filled 2016-11-26 (×12): qty 1

## 2016-11-26 MED ORDER — DIPHENHYDRAMINE-ZINC ACETATE 2-0.1 % EX CREA
TOPICAL_CREAM | CUTANEOUS | Status: DC | PRN
  Administered 2016-11-26 – 2016-11-27 (×2): via TOPICAL
  Filled 2016-11-26: qty 28

## 2016-11-26 MED ORDER — DOXYCYCLINE HYCLATE 100 MG IV SOLR
100.0000 mg | Freq: Two times a day (BID) | INTRAVENOUS | Status: DC
  Administered 2016-11-26 – 2016-11-27 (×2): 100 mg via INTRAVENOUS
  Filled 2016-11-26 (×2): qty 100

## 2016-11-26 MED ORDER — PNEUMOCOCCAL VAC POLYVALENT 25 MCG/0.5ML IJ INJ
0.5000 mL | INJECTION | INTRAMUSCULAR | Status: AC
  Administered 2016-11-27: 0.5 mL via INTRAMUSCULAR
  Filled 2016-11-26: qty 0.5

## 2016-11-26 MED ORDER — DEXTROSE 5 % IV SOLN
2.0000 g | INTRAVENOUS | Status: DC
  Administered 2016-11-26 – 2016-11-29 (×4): 2 g via INTRAVENOUS
  Filled 2016-11-26 (×5): qty 2

## 2016-11-26 NOTE — Progress Notes (Signed)
  Echocardiogram 2D Echocardiogram has been performed.  Gilbert Holmes 11/26/2016, 10:11 AM

## 2016-11-26 NOTE — Progress Notes (Addendum)
Patient ID: Gilbert Holmes, male   DOB: Jun 07, 1951, 66 y.o.   MRN: [id number]  PROGRESS NOTE    Olsen Mccutchan  NHA:[id number] DOB: 24-Dec-1950 DOA: 11/25/2016  PCP: [name], MD   Brief Narrative:  66 y.o. male with past medical history significant for type 2 DM, HTN, HLD, Stage 3 CKD, and tobacco use who presented to [facility] ED on 11/25/2016 for evaluation of worsening dyspnea and lower extremity edema. He has had lower extremity edema for the past 4 weeks. About two weeks ago, he developed associated pruritis with scratching of his lower extremities. Over the past week, he noticed worsening orthopnea and dyspnea with exertion and he had began sleeping on the couch with 4+ pillows.   In the ED, pt was found to be febrile with a fever of 102.8 F with tachycardia and tachypnea. Blood work showed WBC of 16.7, Hgb 10.0, creatinine 2.20. BNP 1532. Lactic acid was 2.52. Initial troponin was 0.54 with repeat values at 0.32, 0.40, and 0.51. Blood cultures were positive for streptococcus. CXR showed marked cardiac enlargement and small bilateral effusions. EKG showed sinus tachycardia with no acute ST or T-wave changes. SIRS criteria met and he was started on azithro and rocephin. He was also given IV Lasix 36m once on admission with bump in creatinine from 2.20 to 2.91.  Assessment & Plan:   Principal Problem: Acute diastolic CHF / Acute respiratory failure with hypoxia  - BNP on admission 1532 - ECHO is pending this am - Cardio has seen him in consultation  Active Problems:   Elevated troponin - Initial troponin was elevated at 0.54 with repeat values at 0.32, 0.40, and 0.51 - The 12 lead EKG was without acute ischemic changes - Appreciate Cardio input; awaiting 2 D ECHO results     Sepsis secondary to streptococcus bacteremia and bilateral LE cellulitis  - Sepsis criteria actually met on admission with fever of 103 F, tachycardia, tachypnea, hypoxia of 92% with [place] oxygen support - CXR  showed congestive heart failure with mild bilateral interstitial edema and small bilateral pleural effusions - Pt on azithro and rocephin and will continue rocephin but will stop azithro - One blood cx showed strep species awaiting final report - Another blood cx showed no growth - Repeat blood cx in am to ensure resolution of strep bacteremia - Influenza negative  - Will add doxycycline fo rLE cellulitis     Acute renal failure superimposed on CKD stage 3 - Baseline creatinine 2.5 in 2013 - Cr on this admission as high as 3.0, likely from sepsis - Continue to monitor renal function daily     Anemia of chronic disease - Due to CKD - Hemoglobin stable     Dyslipidemia - Continue Lipitor and omega 3 supplementation     Essential hypertension - Continue metoprolol 100 mg BID    Tobacco abuse - Continue nicotine patch - Counseled on smoking cessation     Depression - Continue paxil     Morbid obesity - Pt counseled on nutrition and diet  - Body mass index is 33.37 kg/m.   DVT prophylaxis: Lovenox subQ Code Status: full code  Family Communication: family at the bedside  Disposition Plan: not yet stable for discharge; hope in next 2-3 days if cellulitis better and if cleared from cardio standpoint    Consultants:   Cardiology   Procedures:   ECHO  Antimicrobials:   Azithro 11/25/16 --> 11/26/2016  Doxycycline 11/26/2016 -->  Rocephin 11/25/2016 -->  Subjective: No overnight events.  Objective: Vitals:   11/25/16 1815 11/26/16 0510 11/26/16 1027 11/26/16 1458  BP: 138/60 (!) 108/47 (!) 104/39 (!) 95/44  Pulse: 100 62 64 71  Resp: _0 Temp: 99.9 F (37.7 C) 97.9 F (36.6 C)  98.9 F (37.2 C)  TempSrc: Oral Oral  Oral  SpO2: 95% 96%  96%  Weight: 102.5 kg (225 lb 14.4 oz) 102.5 kg (225 lb 15.5 oz)    Height:        Intake/Output Summary (Last 24 hours) at 11/26/16 1622 Last data filed at 11/26/16 1534  Gross per 24 hour  Intake               920 ml  Output             1400 ml  Net             -480 ml   Filed Weights   11/25/16 1227 11/25/16 1815 11/26/16 0510  Weight: 106.6 kg (235 lb) 102.5 kg (225 lb 14.4 oz) 102.5 kg (225 lb 15.5 oz)    Examination:  General exam: Appears calm and comfortable  Respiratory system: Clear to auscultation. Respiratory effort normal. Cardiovascular system: S1 & S2 heard, Rate controlled  Gastrointestinal system: Abdomen is nondistended, soft and nontender. No organomegaly or masses felt. Normal bowel sounds heard. Central nervous system: Alert and oriented. No focal neurological deficits. Extremities: LE swelling bilaterally with redness and warmth to palpation Skin: rash on LE appreciated  Psychiatry: Judgement and insight appear normal. Mood & affect appropriate.   Data Reviewed: I have personally reviewed following labs and imaging studies  CBC:  Recent Labs Lab 11/25/16 1310 11/26/16 0333  WBC 16.7* 12.4*  NEUTROABS 15.3* 10.0*  HGB 10.0* 9.6*  HCT 32.3* 30.0*  MCV 87.1 86.0  PLT 202 709   Basic Metabolic Panel:  Recent Labs Lab 11/25/16 1310 11/26/16 0333 11/26/16 1404  NA 132* 132* 134*  K 4.8 4.2 3.8  CL 100* 99* 100*  CO2 18* 21* 22  GLUCOSE 160* 147* 154*  BUN 36* 47* 53*  CREATININE 2.20* 2.91* 3.00*  CALCIUM 8.7* 8.2* 8.0*   GFR: Estimated Creatinine Clearance: 29 mL/min (A) (by C-G formula based on SCr of 3 mg/dL (H)). Liver Function Tests:  Recent Labs Lab 11/26/16 1404  AST 30  ALT 30  ALKPHOS 41  BILITOT 1.0  PROT 5.4*  ALBUMIN 2.4*   No results for input(s): LIPASE, AMYLASE in the last 168 hours. No results for input(s): AMMONIA in the last 168 hours. Coagulation Profile: No results for input(s): INR, PROTIME in the last 168 hours. Cardiac Enzymes:  Recent Labs Lab 11/25/16 1554 11/25/16 2125 11/26/16 0333  TROPONINI 0.32* 0.40* 0.51*   BNP (last 3 results) No results for input(s): PROBNP in the last 8760 hours. HbA1C: No  results for input(s): HGBA1C in the last 72 hours. CBG: No results for input(s): GLUCAP in the last 168 hours. Lipid Profile: No results for input(s): CHOL, HDL, LDLCALC, TRIG, CHOLHDL, LDLDIRECT in the last 72 hours. Thyroid Function Tests: No results for input(s): TSH, T4TOTAL, FREET4, T3FREE, THYROIDAB in the last 72 hours. Anemia Panel: No results for input(s): VITAMINB12, FOLATE, FERRITIN, TIBC, IRON, RETICCTPCT in the last 72 hours. Urine analysis:    Component Value Date/Time   COLORURINE YELLOW 11/25/2016 1447   APPEARANCEUR HAZY (A) 11/25/2016 1447   LABSPEC 1.015 11/25/2016 1447   PHURINE 5.0 11/25/2016 1447   GLUCOSEU NEGATIVE 11/25/2016  [place] (A) 11/25/2016 1447   BILIRUBINUR NEGATIVE 11/25/2016 1447   KETONESUR 5 (A) 11/25/2016 1447   PROTEINUR 100 (A) 11/25/2016 1447   UROBILINOGEN 0.2 12/25/2011 1850   NITRITE NEGATIVE 11/25/2016 1447   LEUKOCYTESUR NEGATIVE 11/25/2016 1447   Sepsis Labs: _0 (procalcitonin:4,lacticidven:4)   Recent Results (from the past 240 hour(s))  Culture, blood (routine x 2)     Status: None (Preliminary result)   Collection Time: 11/25/16  1:10 PM  Result Value Ref Range Status   Specimen Description BLOOD LEFT ANTECUBITAL  Final   Special Requests BOTTLES DRAWN AEROBIC ONLY 10 CC  Final   Culture NO GROWTH < 24 HOURS  Final   Report Status PENDING  Incomplete  Culture, blood (routine x 2)     Status: Abnormal (Preliminary result)   Collection Time: 11/25/16  1:55 PM  Result Value Ref Range Status   Specimen Description BLOOD LEFT HAND  Final   Special Requests BOTTLES DRAWN AEROBIC AND ANAEROBIC  5CC  Final   Culture  Setup Time   Final    GRAM POSITIVE COCCI IN CHAINS IN PAIRS ANAEROBIC BOTTLE ONLY CRITICAL RESULT CALLED TO, READ BACK BY AND VERIFIED WITH: PHARMD H BAIRD [id number] [phone] MLM    Culture STREPTOCOCCUS SPECIES (A)  Final   Report Status PENDING  Incomplete  Blood Culture ID Panel (Reflexed)      Status: Abnormal   Collection Time: 11/25/16  1:55 PM  Result Value Ref Range Status   Enterococcus species NOT DETECTED NOT DETECTED Final   Listeria monocytogenes NOT DETECTED NOT DETECTED Final   Staphylococcus species NOT DETECTED NOT DETECTED Final   Staphylococcus aureus NOT DETECTED NOT DETECTED Final   Streptococcus species DETECTED (A) NOT DETECTED Final    Comment: Not Enterococcus species, Streptococcus agalactiae, Streptococcus pyogenes, or Streptococcus pneumoniae. CRITICAL RESULT CALLED TO, READ BACK BY AND VERIFIED WITH: PHARMD H BAIRD [phone] MLM    Streptococcus agalactiae NOT DETECTED NOT DETECTED Final   Streptococcus pneumoniae NOT DETECTED NOT DETECTED Final   Streptococcus pyogenes NOT DETECTED NOT DETECTED Final   Acinetobacter baumannii NOT DETECTED NOT DETECTED Final   Enterobacteriaceae species NOT DETECTED NOT DETECTED Final   Enterobacter cloacae complex NOT DETECTED NOT DETECTED Final   Escherichia coli NOT DETECTED NOT DETECTED Final   Klebsiella oxytoca NOT DETECTED NOT DETECTED Final   Klebsiella pneumoniae NOT DETECTED NOT DETECTED Final   Proteus species NOT DETECTED NOT DETECTED Final   Serratia marcescens NOT DETECTED NOT DETECTED Final   Haemophilus influenzae NOT DETECTED NOT DETECTED Final   Neisseria meningitidis NOT DETECTED NOT DETECTED Final   Pseudomonas aeruginosa NOT DETECTED NOT DETECTED Final   Candida albicans NOT DETECTED NOT DETECTED Final   Candida glabrata NOT DETECTED NOT DETECTED Final   Candida krusei NOT DETECTED NOT DETECTED Final   Candida parapsilosis NOT DETECTED NOT DETECTED Final   Candida tropicalis NOT DETECTED NOT DETECTED Final  Urine culture     Status: Abnormal   Collection Time: 11/25/16  2:47 PM  Result Value Ref Range Status   Specimen Description URINE, CLEAN CATCH  Final   Special Requests NONE  Final   Culture <10,000 COLONIES/mL INSIGNIFICANT GROWTH (A)  Final   Report Status 11/26/2016 FINAL  Final        Radiology Studies: Dg Chest 2 View Result Date: 11/26/2016 1. Congestive heart failure with mild bilateral interstitial edema and small bilateral pleural effusions . Findings have improved from prior exam. 2.  No retrocardiac abnormality identified as previously questioned.   Dg Chest Portable 1 View Result Date: 11/25/2016 Marked cardiac enlargement. No definite congestive failure although small BILATERAL effusions are seen. Bibasilar atelectasis. Query retrocardiac process. Recommend two-view chest when stable.     Scheduled Meds: . aspirin  81 mg Oral Daily  . atorvastatin  10 mg Oral Daily  . azithromycin  500 mg Intravenous Q24H  . cefTRIAXone   2 g Intravenous Q24H  . enoxaparin (LOVE  50 mg Subcutaneous Q24H  . loratadine  10 mg Oral Daily  . metoprolol succina  100 mg Oral BID  . multivitamin with mi  1 tablet Oral Daily  . nicotine  14 mg Transdermal Daily  . omega-3 acid ethyl   2 g Oral Daily  . PARoxetine  20 mg Oral q morning - 10a   Continuous Infusions:   LOS: 1 day    Time spent: 25 minutes  Greater than 50% of the time spent on counseling and coordinating the care.   Leisa Lenz, MD [facility] Pager [phone]  If 7PM-7AM, please contact night-coverage [email or web address] Password [facility] 11/26/2016, 4:22 PM

## 2016-11-26 NOTE — Consult Note (Signed)
Cardiology Consult    Patient ID: Gilbert Holmes MRN: [id number], DOB/AGE: 03-10-1951   Admit date: 11/25/2016 Date of Consult: 11/26/2016  Primary Physician: [name], MD Reason for Consult: CHF Primary Cardiologist: New to [facility] - Dr. Harrington Challenger Requesting Provider: Dr. Charlies Silvers  History of Present Illness    Gilbert Holmes is a 66 y.o. male with past medical history of Type 2 DM, HTN, HLD, Stage 3 CKD, and tobacco use who presented to [facility] ED on 11/25/2016 for evaluation of worsening dyspnea and lower extremity edema.  In talking with the patient today, he reports having lower extremity edema for the past 4 weeks. Starting two weeks ago, he developed associated pruritis with scratching of his lower extremities.   Over the past week, he noticed worsening orthopnea and dyspnea with exertion. Orthopnea was his main presenting symptom, as he had began sleeping on the couch with 4+ pillows. The orthopnea was so severe the night before his presentation that he slept upright in his car. Denies any associated chest discomfort or palpitations.   While in the ED, he was found to be febrile with a fever of 102.8 with tachycardia and tachypnea.   Initial labs showed WBC of 16.7, Hgb 10.0, platelets 202. Na+ 132, K+ 4.8, creatinine 2.20. BNP 1532. Lactic Acid 2.52. Initial troponin at 0.54 with repeat values at 0.32, 0.40, and 0.51. Blood cultures positive for streptococcus. CXR with marked cardiac enlargement and small bilateral effusions. EKG shows sinus tachycardia, HR 117, with no acute ST or T-wave changes.   He has been started on Azithromycin and Rocephin for SIRS. Was given IV Lasix 80mg  once on admission with bump in creatinine from 2.20 to 2.91.  He denies any prior cardiac history. Brother had CABG in his 35's. Does smoke 1 ppd and has done so for 30+ years.   Past Medical History   Past Medical History:  Diagnosis Date  . Arthritis   . Diabetes mellitus   . Hypertension       Past Surgical History:  Procedure Laterality Date  . ANKLE FRACTURE SURGERY       Allergies  Allergies  Allergen Reactions  . Naproxen Sodium Swelling    Inpatient Medications    . aspirin  81 mg Oral Daily  . atorvastatin  10 mg Oral Daily  . azithromycin  500 mg Intravenous Q24H  . cefTRIAXone (ROCEPHIN)  IV  2 g Intravenous Q24H  . enoxaparin (LOVENOX) injection  50 mg Subcutaneous Q24H  . loratadine  10 mg Oral Daily  . metoprolol succinate  100 mg Oral BID  . multivitamin with minerals  1 tablet Oral Daily  . nicotine  21 mg Transdermal Once  . omega-3 acid ethyl esters  2 g Oral Daily  . PARoxetine  20 mg Oral q morning - 10a  . sodium chloride flush  3 mL Intravenous Q12H    Family History    Family History  Problem Relation Age of Onset  . CAD Brother     CABG in his 36's.     Social History    Social History   Social History  . Marital status: Divorced    Spouse name: N/A  . Number of children: N/A  . Years of education: N/A   Occupational History  . Not on file.   Social History Main Topics  . Smoking status: Current Every Day Smoker    Packs/day: 1.50    Years: 30.00    Types: Cigarettes  .  Smokeless tobacco: Never Used  . Alcohol use No  . Drug use: No  . Sexual activity: Not on file   Other Topics Concern  . Not on file   Social History Narrative  . No narrative on file     Review of Systems    General:  No chills, fever, night sweats or weight changes.  Cardiovascular:  No chest pain, palpitations, paroxysmal nocturnal dyspnea. Positive for edema, orthopnea, and dyspnea on exertion.  Dermatological: No rash, lesions/masses Respiratory: No cough, dyspnea Urologic: No hematuria, dysuria Abdominal:   No nausea, vomiting, diarrhea, bright red blood per rectum, melena, or hematemesis Neurologic:  No visual changes, wkns, changes in mental status. All other systems reviewed and are otherwise negative except as noted  above.  Physical Exam    Blood pressure (!) 104/39, pulse 64, temperature 97.9 F (36.6 C), temperature source Oral, resp. rate 17, height 5\' 9"  (1.753 m), weight 225 lb 15.5 oz (102.5 kg), SpO2 96 %.  General: Pleasant, Caucasian male appearing in NAD. Psych: Normal affect. Neuro: Alert and oriented X 3. Moves all extremities spontaneously. HEENT: Normal  Neck: Supple without bruits or JVD. Lungs:  Resp regular and unlabored, mild rales at bases bilaterally. Heart: RRR no s3, s4, or murmurs. Abdomen: Soft, non-tender, non-distended, BS + x 4.  Extremities: No clubbing or cyanosis 2+ pitting edema up to mid-shins bilaterally with significant erythema and excoriations noted. DP/PT/Radials 2+ and equal bilaterally.  Labs    Troponin [facility] of Care Test)  Recent Labs  11/25/16 1318  TROPIPOC 0.54*    Recent Labs  11/25/16 1554 11/25/16 2125 11/26/16 0333  TROPONINI 0.32* 0.40* 0.51*   Lab Results  Component Value Date   WBC 12.4 (H) 11/26/2016   HGB 9.6 (L) 11/26/2016   HCT 30.0 (L) 11/26/2016   MCV 86.0 11/26/2016   PLT 179 11/26/2016     Recent Labs Lab 11/26/16 0333  NA 132*  K 4.2  CL 99*  CO2 21*  BUN 47*  CREATININE 2.91*  CALCIUM 8.2*  GLUCOSE 147*   No results found for: CHOL, HDL, LDLCALC, TRIG No results found for: [facility]   Radiology Studies    Dg Chest 2 View  Result Date: 11/26/2016 CLINICAL DATA:  Shortness of breath. EXAM: CHEST  2 VIEW COMPARISON:  11/25/2016. FINDINGS: Cardiomegaly with mild interstitial prominence noted bilaterally. Findings suggest mild CHF. Small bilateral pleural effusions. No pneumothorax. IMPRESSION: 1. Congestive heart failure with mild bilateral interstitial edema and small bilateral pleural effusions . Findings have improved from prior exam. 2.  No retrocardiac abnormality identified as previously questioned. Electronically Signed   By: Marcello Moores  Register   On: 11/26/2016 07:56   Dg Chest Portable 1 View  Result  Date: 11/25/2016 CLINICAL DATA:  Shortness of breath and swelling of the legs for 1 month. EXAM: PORTABLE CHEST 1 VIEW COMPARISON:  None. FINDINGS: The heart is markedly enlarged. Bibasilar subsegmental atelectasis is present. There is prominence of the aortic knob. Small effusions are suspected. There does not appear to be frank edema although asymmetric retrocardiac opacity could reflect LEFT lower lobe process, versus simply opacity due to the enlarged heart shadow. There is no pneumothorax. No osseous findings. IMPRESSION: Marked cardiac enlargement. No definite congestive failure although small BILATERAL effusions are seen. Bibasilar atelectasis. Query retrocardiac process. Recommend two-view chest when stable. Electronically Signed   By: Staci Righter M.D.   On: 11/25/2016 12:52    EKG & Cardiac Imaging    EKG: Sinus  tachycardia, HR 117, with no acute ST or T-wave changes - Personally Reviewed  Echocardiogram: Pending  Assessment & Plan    1. Acute CHF Exacerbation - patient reports having lower extremity edema for the past 4 weeks with worsening orthopnea and dyspnea with exertion for the past week. - BNP 1532 on arrival. CXR with marked cardiac enlargement and small bilateral effusions.  - Was given IV Lasix 80mg  once on admission with bump in creatinine from 2.20 to 2.91. IV Lasix now held.  - echocardiogram is pending to assess LV function and wall motion which will help [place] further clarify his type of CHF. Would hold further Lasix for now and reassess creatinine in AM.   2. Elevated Troponin - initial troponin at 0.54 with repeat values at 0.32, 0.40, and 0.51. EKG without acute ischemic changes.  - he denies any recent chest discomfort. No prior cardiac history. Does have cardiac risk factors including HTN, HLD, Type 2 DM, and family history of CAD (brother having CABG in his 21's).  - echo pending to assess LV function and wall motion. I 3. HTN - BP variable at 100/39 - 179/140 in  the past 24 hours. At 104/29 on most recent check. - continue current medication regimen for now.   4. HLD - continue statin therapy.  - check FLP in AM.   5. AKI - creatinine 2.20 on arrival, at 2.91 this AM.  - repeat BMET in AM.   6. SIRS/ Streptococcus Bacteremia - febrile up to 102.8 on arrival with tachycardia and tachypnea. Lactic Acid 2.52. Blood cultures positive for streptococcus. Poss from legs   - Started on Azithromycin and Rocephin.  - per admitting team  7  Tobacco abuse  Pt cutting back on smoking    Signed, Erma Heritage, PA-C 11/26/2016, 10:43 AM Pager: [phone]   Pt seen and examined   I agree with findings of B Strader above  I have amended note to reflect my findings   Pt is a 66 yo with no prior history of CHF   Presents with several wk history of increased SOB and edema  He says he was fine a couple months ago Denies CP  Long standing tobacco use  Deneis excess salt in diet    On exam pt is volume overloaded   Febrile yesterday  JVP increased  Lungs rel clear  Cardiac exam RRR  No S3  No murmurs  Ext with 2+ edema  Warm  Red  Excorated Echo shows normal LV and RV systolic functoni Labs signif elevated WBC. Hgb 9.6  BUN/Cr 47/2.91   Mild elevation of trop 0.51 max so far  Etiology of acute diastolic CHF unclear   True baseline renal function unclear   Would check albumin Got 1 dose of lasix so far  Would follow renal functon before diuresing further  Consider renal consult. Elevated trop prob reflects demand ischemia in setting of infection. I would not schedule further cardiac testing for now. Will continue to follow  Dorris Carnes

## 2016-11-26 NOTE — Progress Notes (Signed)
PHARMACY - PHYSICIAN COMMUNICATION CRITICAL VALUE ALERT - BLOOD CULTURE IDENTIFICATION (BCID)  Results for orders placed or performed during the hospital encounter of 11/25/16  Blood Culture ID Panel (Reflexed) (Collected: 11/25/2016  1:55 PM)  Result Value Ref Range   Enterococcus species NOT DETECTED NOT DETECTED   Listeria monocytogenes NOT DETECTED NOT DETECTED   Staphylococcus species NOT DETECTED NOT DETECTED   Staphylococcus aureus NOT DETECTED NOT DETECTED   Streptococcus species DETECTED (A) NOT DETECTED   Streptococcus agalactiae NOT DETECTED NOT DETECTED   Streptococcus pneumoniae NOT DETECTED NOT DETECTED   Streptococcus pyogenes NOT DETECTED NOT DETECTED   Acinetobacter baumannii NOT DETECTED NOT DETECTED   Enterobacteriaceae species NOT DETECTED NOT DETECTED   Enterobacter cloacae complex NOT DETECTED NOT DETECTED   Escherichia coli NOT DETECTED NOT DETECTED   Klebsiella oxytoca NOT DETECTED NOT DETECTED   Klebsiella pneumoniae NOT DETECTED NOT DETECTED   Proteus species NOT DETECTED NOT DETECTED   Serratia marcescens NOT DETECTED NOT DETECTED   Haemophilus influenzae NOT DETECTED NOT DETECTED   Neisseria meningitidis NOT DETECTED NOT DETECTED   Pseudomonas aeruginosa NOT DETECTED NOT DETECTED   Candida albicans NOT DETECTED NOT DETECTED   Candida glabrata NOT DETECTED NOT DETECTED   Candida krusei NOT DETECTED NOT DETECTED   Candida parapsilosis NOT DETECTED NOT DETECTED   Candida tropicalis NOT DETECTED NOT DETECTED    Name of physician (or Provider) Contacted: Dr. Charlies Silvers  Changes to prescribed antibiotics required: Increase ceftriaxone to 2 gram IV every 24 hours pending final c/s of blood culture.   Vincenza Hews, PharmD, BCPS 11/26/2016, 8:24 AM

## 2016-11-27 DIAGNOSIS — L03119 Cellulitis of unspecified part of limb: Secondary | ICD-10-CM

## 2016-11-27 DIAGNOSIS — L02419 Cutaneous abscess of limb, unspecified: Secondary | ICD-10-CM

## 2016-11-27 LAB — CBC
HEMATOCRIT: 29.9 % — AB (ref 39.0–52.0)
HEMOGLOBIN: 9.4 g/dL — AB (ref 13.0–17.0)
MCH: 27.3 pg (ref 26.0–34.0)
MCHC: 31.4 g/dL (ref 30.0–36.0)
MCV: 86.9 fL (ref 78.0–100.0)
PLATELETS: 204 10*3/uL (ref 150–400)
RBC: 3.44 MIL/uL — AB (ref 4.22–5.81)
RDW: 17.5 % — AB (ref 11.5–15.5)
WBC: 9.4 10*3/uL (ref 4.0–10.5)

## 2016-11-27 LAB — BASIC METABOLIC PANEL
ANION GAP: 11 (ref 5–15)
BUN: 54 mg/dL — ABNORMAL HIGH (ref 6–20)
CALCIUM: 7.9 mg/dL — AB (ref 8.9–10.3)
CO2: 23 mmol/L (ref 22–32)
CREATININE: 2.84 mg/dL — AB (ref 0.61–1.24)
Chloride: 100 mmol/L — ABNORMAL LOW (ref 101–111)
GFR, EST AFRICAN AMERICAN: 25 mL/min — AB (ref >60)
GFR, EST NON AFRICAN AMERICAN: 22 mL/min — AB (ref >60)
Glucose, Bld: 127 mg/dL — ABNORMAL HIGH (ref 65–99)
Potassium: 4 mmol/L (ref 3.5–5.1)
SODIUM: 134 mmol/L — AB (ref 135–145)

## 2016-11-27 LAB — LIPID PANEL
Cholesterol: 108 mg/dL (ref 0–200)
HDL: 13 mg/dL — AB (ref >40)
LDL CALC: 68 mg/dL (ref 0–99)
Total CHOL/HDL Ratio: 8.3 RATIO
Triglycerides: 137 mg/dL (ref <150)
VLDL: 27 mg/dL (ref 0–40)

## 2016-11-27 LAB — PROCALCITONIN: Procalcitonin: 4.19 ng/mL

## 2016-11-27 LAB — MAGNESIUM: Magnesium: 2.1 mg/dL (ref 1.7–2.4)

## 2016-11-27 MED ORDER — DOXYCYCLINE HYCLATE 100 MG PO TABS
100.0000 mg | ORAL_TABLET | Freq: Two times a day (BID) | ORAL | Status: DC
  Administered 2016-11-27 – 2016-11-30 (×6): 100 mg via ORAL
  Filled 2016-11-27 (×6): qty 1

## 2016-11-27 MED ORDER — DIPHENHYDRAMINE HCL 25 MG PO CAPS
25.0000 mg | ORAL_CAPSULE | Freq: Four times a day (QID) | ORAL | Status: DC | PRN
  Administered 2016-11-27 – 2016-11-28 (×2): 25 mg via ORAL
  Filled 2016-11-27 (×2): qty 1

## 2016-11-27 NOTE — Progress Notes (Signed)
[employer] called.  Patient just had burst of SVT ; is now back in the 60s. NP notified.  [place] just notified me patient had a burst of SVT and is now back in the 32s. thank you.

## 2016-11-27 NOTE — Progress Notes (Addendum)
Patient ID: Gilbert Holmes, male   DOB: 12-17-1950, 66 y.o.   MRN: [id number]  PROGRESS NOTE    Trayton Szabo  ACZ:[id number] DOB: 07/09/1951 DOA: 11/25/2016  PCP: [name], MD   Brief Narrative:  66 y.o. male with past medical history significant for type 2 DM, HTN, HLD, Stage 3 CKD, and tobacco use who presented to [facility] ED on 11/25/2016 for evaluation of worsening dyspnea and lower extremity edema. He has had lower extremity edema for the past 4 weeks. About two weeks ago, he developed associated pruritis with scratching of his lower extremities. Over the past week, he noticed worsening orthopnea and dyspnea with exertion and he had began sleeping on the couch with 4+ pillows.  He was found to have cellulitis, fluid overload with normal echo, and +strep pna antigen positive.  Assessment & Plan:   Volume overload -echo: Left ventricle: The cavity size was normal. There was mild concentric hypertrophy. Systolic function was normal. The estimated ejection fraction was in the range of 55% to 60%. Left  ventricular diastolic function parameters were normal. - Left atrium: The atrium was moderately dilated. - Atrial septum: No defect or patent foramen ovale was identified. - Pulmonary arteries: PA peak pressure: 41 mm Hg (S).  - BNP on admission 1532 (has CKD) -albumin low -patient does sit for long periods of time with legs dangling    Elevated troponin - Initial troponin was elevated at 0.54 with repeat values at 0.32, 0.40, and 0.51 - The 12 lead EKG was without acute ischemic changes -cardiology following  SVT -limited overnight- back to NSR    Sepsis secondary to streptococcus bacteremia (1/2) and bilateral LE cellulitis /leukocytosis - Sepsis criteria actually met on admission with fever of 103 F, tachycardia, tachypnea, hypoxia of 92% with [place] oxygen support - CXR showed congestive heart failure with mild bilateral interstitial edema and small bilateral pleural  effusions - Pt given azithro and rocephin and will continue rocephin but will stop azithro - One blood cx showed strep species awaiting final report - Another blood cx showed no growth - Repeat blood cx - Influenza negative  - on doxycycline for RLE cellulitis  -WBC decreasing  CKD stage 4 - Baseline creatinine 2.5 in 2013 - Cr on this admission as high as 3.0, likely from sepsis - Continue to monitor renal function daily  -does not have a renal doctor- will need outpatient referral -minimal protein in urine -? 3rd spacing from hypoalbumenia -HgbA1C pending    Anemia of chronic disease - Due to CKD - Hemoglobin stable     Dyslipidemia - Continue Lipitor and omega 3 supplementation     Essential hypertension - Continue metoprolol 100 mg BID    Tobacco abuse - Continue nicotine patch - Counseled on smoking cessation     Depression - Continue paxil     Morbid obesity - Pt counseled on nutrition and diet  - Body mass index is 33.37 kg/m.  Screening HIV negative -hep c ordered   DVT prophylaxis: Lovenox subQ Code Status: full code  Family Communication: patient  Disposition Plan: not yet stable for discharge; hope in next 2-3 days if cellulitis better and if cleared from cardio standpoint    Consultants:   Cardiology   Procedures:   ECHO  Antimicrobials:   Azithro 11/25/16 --> 11/26/2016  Doxycycline 11/26/2016 -->  Rocephin 11/25/2016 -->   Subjective: Less itching Breathing better   Objective: Vitals:   11/26/16 1458 11/26/16 2248 11/27/16 0505 11/27/16  0535  BP: (!) 95/44 (!) 118/45 (!) 123/58   Pulse: 71 71 76   Resp: 16  18   Temp: 98.9 F (37.2 C) 98.6 F (37 C) 98.4 F (36.9 C)   TempSrc: Oral Oral Oral   SpO2: 96% 100% 100%   Weight:    104.7 kg (230 lb 12.8 oz)  Height:        Intake/Output Summary (Last 24 hours) at 11/27/16 0826 Last data filed at 11/26/16 9163  Gross per 24 hour  Intake              550 ml  Output                 0 ml  Net              550 ml   Filed Weights   11/25/16 1815 11/26/16 0510 11/27/16 0535  Weight: 102.5 kg (225 lb 14.4 oz) 102.5 kg (225 lb 15.5 oz) 104.7 kg (230 lb 12.8 oz)    Examination:  General exam: Appears calm and comfortable, NAD- not on any O2 Respiratory system: diminished, no wheezing Cardiovascular system: S1 & S2 heard, rrr Gastrointestinal system: Abdomen is obese, soft and nontender.  Normal bowel sounds heard. Central nervous system: A+Ox3 Extremities: LE swelling bilaterally with redness and warmth to palpation L>R Skin: rash on LE appreciated - appears to be from excoriations Psychiatry: Judgement and insight appear normal. Mood & affect appropriate.   Data Reviewed:  CBC:  Recent Labs Lab 11/25/16 1310 11/26/16 0333 11/27/16 0450  WBC 16.7* 12.4* 9.4  NEUTROABS 15.3* 10.0*  --   HGB 10.0* 9.6* 9.4*  HCT 32.3* 30.0* 29.9*  MCV 87.1 86.0 86.9  PLT 202 179 846   Basic Metabolic Panel:  Recent Labs Lab 11/25/16 1310 11/26/16 0333 11/26/16 1404 11/27/16 0450  NA 132* 132* 134* 134*  K 4.8 4.2 3.8 4.0  CL 100* 99* 100* 100*  CO2 18* 21* 22 23  GLUCOSE 160* 147* 154* 127*  BUN 36* 47* 53* 54*  CREATININE 2.20* 2.91* 3.00* 2.84*  CALCIUM 8.7* 8.2* 8.0* 7.9*   GFR: Estimated Creatinine Clearance: 30.9 mL/min (A) (by C-G formula based on SCr of 2.84 mg/dL (H)). Liver Function Tests:  Recent Labs Lab 11/26/16 1404  AST 30  ALT 30  ALKPHOS 41  BILITOT 1.0  PROT 5.4*  ALBUMIN 2.4*   No results for input(s): LIPASE, AMYLASE in the last 168 hours. No results for input(s): AMMONIA in the last 168 hours. Coagulation Profile: No results for input(s): INR, PROTIME in the last 168 hours. Cardiac Enzymes:  Recent Labs Lab 11/25/16 1554 11/25/16 2125 11/26/16 0333  TROPONINI 0.32* 0.40* 0.51*   BNP (last 3 results) No results for input(s): PROBNP in the last 8760 hours. HbA1C: No results for input(s): HGBA1C in the last 72  hours. CBG: No results for input(s): GLUCAP in the last 168 hours. Lipid Profile:  Recent Labs  11/27/16 0450  CHOL 108  HDL 13*  LDLCALC 68  TRIG 137  CHOLHDL 8.3   Thyroid Function Tests: No results for input(s): TSH, T4TOTAL, FREET4, T3FREE, THYROIDAB in the last 72 hours. Anemia Panel: No results for input(s): VITAMINB12, FOLATE, FERRITIN, TIBC, IRON, RETICCTPCT in the last 72 hours. Urine analysis:    Component Value Date/Time   COLORURINE YELLOW 11/25/2016 1447   APPEARANCEUR HAZY (A) 11/25/2016 1447   LABSPEC 1.015 11/25/2016 1447   PHURINE 5.0 11/25/2016 1447   GLUCOSEU NEGATIVE 11/25/2016 1447  HGBUR MODERATE (A) 11/25/2016 1447   BILIRUBINUR NEGATIVE 11/25/2016 1447   KETONESUR 5 (A) 11/25/2016 1447   PROTEINUR 100 (A) 11/25/2016 1447   UROBILINOGEN 0.2 12/25/2011 1850   NITRITE NEGATIVE 11/25/2016 1447   LEUKOCYTESUR NEGATIVE 11/25/2016 1447      Recent Results (from the past 240 hour(s))  Culture, blood (routine x 2)     Status: None (Preliminary result)   Collection Time: 11/25/16  1:10 PM  Result Value Ref Range Status   Specimen Description BLOOD LEFT ANTECUBITAL  Final   Special Requests BOTTLES DRAWN AEROBIC ONLY 10 CC  Final   Culture NO GROWTH < 24 HOURS  Final   Report Status PENDING  Incomplete  Culture, blood (routine x 2)     Status: Abnormal (Preliminary result)   Collection Time: 11/25/16  1:55 PM  Result Value Ref Range Status   Specimen Description BLOOD LEFT HAND  Final   Special Requests BOTTLES DRAWN AEROBIC AND ANAEROBIC  5CC  Final   Culture  Setup Time   Final    GRAM POSITIVE COCCI IN CHAINS IN PAIRS ANAEROBIC BOTTLE ONLY CRITICAL RESULT CALLED TO, READ BACK BY AND VERIFIED WITH: PHARMD H BAIRD [id number] MLM    Culture STREPTOCOCCUS SPECIES (A)  Final   Report Status PENDING  Incomplete  Blood Culture ID Panel (Reflexed)     Status: Abnormal   Collection Time: 11/25/16  1:55 PM  Result Value Ref Range Status    Enterococcus species NOT DETECTED NOT DETECTED Final   Listeria monocytogenes NOT DETECTED NOT DETECTED Final   Staphylococcus species NOT DETECTED NOT DETECTED Final   Staphylococcus aureus NOT DETECTED NOT DETECTED Final   Streptococcus species DETECTED (A) NOT DETECTED Final    Comment: Not Enterococcus species, Streptococcus agalactiae, Streptococcus pyogenes, or Streptococcus pneumoniae. CRITICAL RESULT CALLED TO, READ BACK BY AND VERIFIED WITH: PHARMD H BAIRD [id number] MLM    Streptococcus agalactiae NOT DETECTED NOT DETECTED Final   Streptococcus pneumoniae NOT DETECTED NOT DETECTED Final   Streptococcus pyogenes NOT DETECTED NOT DETECTED Final   Acinetobacter baumannii NOT DETECTED NOT DETECTED Final   Enterobacteriaceae species NOT DETECTED NOT DETECTED Final   Enterobacter cloacae complex NOT DETECTED NOT DETECTED Final   Escherichia coli NOT DETECTED NOT DETECTED Final   Klebsiella oxytoca NOT DETECTED NOT DETECTED Final   Klebsiella pneumoniae NOT DETECTED NOT DETECTED Final   Proteus species NOT DETECTED NOT DETECTED Final   Serratia marcescens NOT DETECTED NOT DETECTED Final   Haemophilus influenzae NOT DETECTED NOT DETECTED Final   Neisseria meningitidis NOT DETECTED NOT DETECTED Final   Pseudomonas aeruginosa NOT DETECTED NOT DETECTED Final   Candida albicans NOT DETECTED NOT DETECTED Final   Candida glabrata NOT DETECTED NOT DETECTED Final   Candida krusei NOT DETECTED NOT DETECTED Final   Candida parapsilosis NOT DETECTED NOT DETECTED Final   Candida tropicalis NOT DETECTED NOT DETECTED Final  Urine culture     Status: Abnormal   Collection Time: 11/25/16  2:47 PM  Result Value Ref Range Status   Specimen Description URINE, CLEAN CATCH  Final   Special Requests NONE  Final   Culture <10,000 COLONIES/mL INSIGNIFICANT GROWTH (A)  Final   Report Status 11/26/2016 FINAL  Final      Radiology Studies: Dg Chest 2 View Result Date: 11/26/2016 1. Congestive heart  failure with mild bilateral interstitial edema and small bilateral pleural effusions . Findings have improved from prior exam. 2.  No retrocardiac abnormality identified as  previously questioned.   Dg Chest Portable 1 View Result Date: 11/25/2016 Marked cardiac enlargement. No definite congestive failure although small BILATERAL effusions are seen. Bibasilar atelectasis. Query retrocardiac process. Recommend two-view chest when stable.     Scheduled Meds: . aspirin  81 mg Oral Daily  . atorvastatin  10 mg Oral Daily  . azithromycin  500 mg Intravenous Q24H  . cefTRIAXone   2 g Intravenous Q24H  . enoxaparin (LOVE  50 mg Subcutaneous Q24H  . loratadine  10 mg Oral Daily  . metoprolol succina  100 mg Oral BID  . multivitamin with mi  1 tablet Oral Daily  . nicotine  14 mg Transdermal Daily  . omega-3 acid ethyl   2 g Oral Daily  . PARoxetine  20 mg Oral q morning - 10a   Continuous Infusions:   LOS: 2 days    Time spent: 25 minutes     [place], DO [facility]   If 7PM-7AM, please contact night-coverage [email or web address] Password [facility] 11/27/2016, 8:26 AM

## 2016-11-27 NOTE — Progress Notes (Signed)
Progress Note  Patient Name: Gilbert Holmes Date of Encounter: 11/27/2016  Primary Cardiologist: New to Dr. Harrington Challenger  Subjective   Feeling well. No chest pain, sob or palpitations.   Inpatient Medications    Scheduled Meds: . aspirin  81 mg Oral Daily  . atorvastatin  10 mg Oral Daily  . cefTRIAXone (ROCEPHIN)  IV  2 g Intravenous Q24H  . cyclobenzaprine  5 mg Oral TID  . doxycycline (VIBRAMYCIN) IV  100 mg Intravenous Q12H  . loratadine  10 mg Oral Daily  . metoprolol succinate  100 mg Oral BID  . multivitamin with minerals  1 tablet Oral Daily  . nicotine  14 mg Transdermal Daily  . omega-3 acid ethyl esters  2 g Oral Daily  . PARoxetine  20 mg Oral q morning - 10a  . sodium chloride flush  3 mL Intravenous Q12H   Continuous Infusions:  PRN Meds: sodium chloride, acetaminophen, clonazePAM, diphenhydrAMINE, diphenhydrAMINE-zinc acetate, ondansetron (ZOFRAN) IV, sodium chloride flush   Vital Signs    Vitals:   11/26/16 1458 11/26/16 2248 11/27/16 0505 11/27/16 0535  BP: (!) 95/44 (!) 118/45 (!) 123/58   Pulse: 71 71 76   Resp: 16  18   Temp: 98.9 F (37.2 C) 98.6 F (37 C) 98.4 F (36.9 C)   TempSrc: Oral Oral Oral   SpO2: 96% 100% 100%   Weight:    230 lb 12.8 oz (104.7 kg)  Height:        Intake/Output Summary (Last 24 hours) at 11/27/16 1005 Last data filed at 11/26/16 1848  Gross per 24 hour  Intake              550 ml  Output                0 ml  Net              550 ml   Filed Weights   11/25/16 1815 11/26/16 0510 11/27/16 0535  Weight: 225 lb 14.4 oz (102.5 kg) 225 lb 15.5 oz (102.5 kg) 230 lb 12.8 oz (104.7 kg)    Telemetry    NSR with 2 episode of SVTs - Personally Reviewed  ECG  N/A  Physical Exam   GEN: No acute distress.   Neck: No JVD Cardiac: RRR, no murmurs, rubs, or gallops. Erythema on BL LE, improving with mild edema Respiratory: Clear to auscultation bilaterally. GI: Soft, nontender, non-distended  MS: No edema; No  deformity. Neuro:  Nonfocal  Psych: Normal affect   Labs    Chemistry Recent Labs Lab 11/26/16 0333 11/26/16 1404 11/27/16 0450  NA 132* 134* 134*  K 4.2 3.8 4.0  CL 99* 100* 100*  CO2 21* 22 23  GLUCOSE 147* 154* 127*  BUN 47* 53* 54*  CREATININE 2.91* 3.00* 2.84*  CALCIUM 8.2* 8.0* 7.9*  PROT  --  5.4*  --   ALBUMIN  --  2.4*  --   AST  --  30  --   ALT  --  30  --   ALKPHOS  --  41  --   BILITOT  --  1.0  --   GFRNONAA 21* 20* 22*  GFRAA 25* 24* 25*  ANIONGAP 12 12 11      Hematology Recent Labs Lab 11/25/16 1310 11/26/16 0333 11/27/16 0450  WBC 16.7* 12.4* 9.4  RBC 3.71* 3.49* 3.44*  HGB 10.0* 9.6* 9.4*  HCT 32.3* 30.0* 29.9*  MCV 87.1 86.0 86.9  MCH 27.0  27.5 27.3  MCHC 31.0 32.0 31.4  RDW 17.3* 17.6* 17.5*  PLT 202 179 204    Cardiac Enzymes Recent Labs Lab 11/25/16 1554 11/25/16 2125 11/26/16 0333  TROPONINI 0.32* 0.40* 0.51*    Recent Labs Lab 11/25/16 1318  TROPIPOC 0.54*     BNP Recent Labs Lab 11/25/16 1231  BNP 1,532.6*     DDimer No results for input(s): DDIMER in the last 168 hours.   Radiology    Dg Chest 2 View  Result Date: 11/26/2016 CLINICAL DATA:  Shortness of breath. EXAM: CHEST  2 VIEW COMPARISON:  11/25/2016. FINDINGS: Cardiomegaly with mild interstitial prominence noted bilaterally. Findings suggest mild CHF. Small bilateral pleural effusions. No pneumothorax. IMPRESSION: 1. Congestive heart failure with mild bilateral interstitial edema and small bilateral pleural effusions . Findings have improved from prior exam. 2.  No retrocardiac abnormality identified as previously questioned. Electronically Signed   By: Marcello Moores  Register   On: 11/26/2016 07:56   Dg Chest Portable 1 View  Result Date: 11/25/2016 CLINICAL DATA:  Shortness of breath and swelling of the legs for 1 month. EXAM: PORTABLE CHEST 1 VIEW COMPARISON:  None. FINDINGS: The heart is markedly enlarged. Bibasilar subsegmental atelectasis is present. There is  prominence of the aortic knob. Small effusions are suspected. There does not appear to be frank edema although asymmetric retrocardiac opacity could reflect LEFT lower lobe process, versus simply opacity due to the enlarged heart shadow. There is no pneumothorax. No osseous findings. IMPRESSION: Marked cardiac enlargement. No definite congestive failure although small BILATERAL effusions are seen. Bibasilar atelectasis. Query retrocardiac process. Recommend two-view chest when stable. Electronically Signed   By: Staci Righter M.D.   On: 11/25/2016 12:52    Cardiac Studies   Echo 11/26/16 Study Conclusions  - Left ventricle: The cavity size was normal. There was mild   concentric hypertrophy. Systolic function was normal. The   estimated ejection fraction was in the range of 55% to 60%. Left   ventricular diastolic function parameters were normal. - Left atrium: The atrium was moderately dilated. - Atrial septum: No defect or patent foramen ovale was identified. - Pulmonary arteries: PA peak pressure: 41 mm Hg (S).   Patient Profile     Gilbert Holmes is a 66 y.o. male with past medical history of Type 2 DM, HTN, HLD, Stage 3 CKD, and tobacco use who presented to [facility] ED on 11/25/2016 for evaluation of worsening dyspnea and lower extremity edema. Treated with Abx for sepsis 2nd to  streptococcus bacteremia (1/2) and bilateral LE cellulitis /leukocytosis.    Assessment & Plan    1. Acute diastolic CHF - - Given IV Lasix 80mg  once on 3/21 with bump in creatinine from 2.20 to 2.91. Scr now improved to 2.84 with holding diuretics. Overall fluid status appears improved from yesterday  Edema in legs is less proiminent   - Echo showed normal LV function of 55-60%, concentric hypertrophy, PA pressure of 29mm Hg.  - Low albumin contrib to tendency to retain  .   2. Elevated troponin - Initial troponin at 0.54 with repeat values at 0.32, 0.40, and 0.51. EKG without acute ischemic changes.   - Probable demand ischemia in setting of acute illness.   3. HLD - normal LFT. Continue statin.   4. HTN - Relatively stable on current medication  Signed, Bhagat,Bhavinkumar, PA  11/27/2016, 10:05 AM    Pt seen and examined  I agree with findings as noted above by B Bhagat  66 yo presents with fever, acute diastolic CHF  Pt clinically hs improved from yesterday though numbers do not reflect it Lungs:  Mild rales at bases  Cardiac RRR  No S3  Ext with 1+ edema  Less erythematous   Cr is a little better though still 2.84 which limits diuresis   Cr was 2.2 a few days ago I have amended diet to low Na 1500 cc  Recheck Cr in am and if able dose again with lasix.  Dorris Carnes

## 2016-11-28 DIAGNOSIS — L03115 Cellulitis of right lower limb: Secondary | ICD-10-CM

## 2016-11-28 DIAGNOSIS — D638 Anemia in other chronic diseases classified elsewhere: Secondary | ICD-10-CM

## 2016-11-28 DIAGNOSIS — N184 Chronic kidney disease, stage 4 (severe): Secondary | ICD-10-CM

## 2016-11-28 DIAGNOSIS — N183 Chronic kidney disease, stage 3 unspecified: Secondary | ICD-10-CM | POA: Diagnosis present

## 2016-11-28 DIAGNOSIS — F339 Major depressive disorder, recurrent, unspecified: Secondary | ICD-10-CM

## 2016-11-28 DIAGNOSIS — A409 Streptococcal sepsis, unspecified: Secondary | ICD-10-CM

## 2016-11-28 DIAGNOSIS — F32A Depression, unspecified: Secondary | ICD-10-CM

## 2016-11-28 DIAGNOSIS — F329 Major depressive disorder, single episode, unspecified: Secondary | ICD-10-CM | POA: Diagnosis present

## 2016-11-28 DIAGNOSIS — D631 Anemia in chronic kidney disease: Secondary | ICD-10-CM | POA: Insufficient documentation

## 2016-11-28 DIAGNOSIS — E784 Other hyperlipidemia: Secondary | ICD-10-CM

## 2016-11-28 DIAGNOSIS — E785 Hyperlipidemia, unspecified: Secondary | ICD-10-CM

## 2016-11-28 DIAGNOSIS — I5031 Acute diastolic (congestive) heart failure: Secondary | ICD-10-CM | POA: Diagnosis present

## 2016-11-28 DIAGNOSIS — L03116 Cellulitis of left lower limb: Secondary | ICD-10-CM

## 2016-11-28 DIAGNOSIS — R6520 Severe sepsis without septic shock: Secondary | ICD-10-CM

## 2016-11-28 DIAGNOSIS — J9601 Acute respiratory failure with hypoxia: Secondary | ICD-10-CM

## 2016-11-28 DIAGNOSIS — Z720 Tobacco use: Secondary | ICD-10-CM

## 2016-11-28 DIAGNOSIS — N179 Acute kidney failure, unspecified: Secondary | ICD-10-CM

## 2016-11-28 DIAGNOSIS — F3342 Major depressive disorder, recurrent, in full remission: Secondary | ICD-10-CM | POA: Insufficient documentation

## 2016-11-28 DIAGNOSIS — I10 Essential (primary) hypertension: Secondary | ICD-10-CM

## 2016-11-28 DIAGNOSIS — F3289 Other specified depressive episodes: Secondary | ICD-10-CM

## 2016-11-28 HISTORY — DX: Hyperlipidemia, unspecified: E78.5

## 2016-11-28 HISTORY — DX: Tobacco use: Z72.0

## 2016-11-28 HISTORY — DX: Acute kidney failure, unspecified: N17.9

## 2016-11-28 HISTORY — DX: Depression, unspecified: F32.A

## 2016-11-28 HISTORY — DX: Acute respiratory failure with hypoxia: J96.01

## 2016-11-28 HISTORY — DX: Anemia in chronic kidney disease: N18.4

## 2016-11-28 HISTORY — DX: Anemia in chronic kidney disease: D63.1

## 2016-11-28 HISTORY — DX: Major depressive disorder, recurrent, unspecified: F33.9

## 2016-11-28 HISTORY — DX: Acute diastolic (congestive) heart failure: I50.31

## 2016-11-28 HISTORY — DX: Cellulitis of right lower limb: L03.115

## 2016-11-28 HISTORY — DX: Anemia in other chronic diseases classified elsewhere: D63.8

## 2016-11-28 HISTORY — DX: Chronic kidney disease, stage 3 unspecified: N18.30

## 2016-11-28 HISTORY — DX: Severe sepsis without septic shock: A40.9

## 2016-11-28 LAB — CBC
HCT: 30 % — ABNORMAL LOW (ref 39.0–52.0)
Hemoglobin: 9.1 g/dL — ABNORMAL LOW (ref 13.0–17.0)
MCH: 26.4 pg (ref 26.0–34.0)
MCHC: 30.3 g/dL (ref 30.0–36.0)
MCV: 87 fL (ref 78.0–100.0)
PLATELETS: 243 10*3/uL (ref 150–400)
RBC: 3.45 MIL/uL — ABNORMAL LOW (ref 4.22–5.81)
RDW: 17.1 % — AB (ref 11.5–15.5)
WBC: 11.4 10*3/uL — ABNORMAL HIGH (ref 4.0–10.5)

## 2016-11-28 LAB — HEPATITIS PANEL, ACUTE
HCV Ab: <0.1 {s_co_ratio} (ref 0.0–0.9)
Hep A IgM: NEGATIVE
Hep B C IgM: NEGATIVE
Hepatitis B Surface Ag: NEGATIVE

## 2016-11-28 LAB — BASIC METABOLIC PANEL
Anion gap: 10 (ref 5–15)
BUN: 47 mg/dL — ABNORMAL HIGH (ref 6–20)
CO2: 23 mmol/L (ref 22–32)
CREATININE: 2.52 mg/dL — AB (ref 0.61–1.24)
Calcium: 8 mg/dL — ABNORMAL LOW (ref 8.9–10.3)
Chloride: 103 mmol/L (ref 101–111)
GFR, EST AFRICAN AMERICAN: 29 mL/min — AB (ref >60)
GFR, EST NON AFRICAN AMERICAN: 25 mL/min — AB (ref >60)
Glucose, Bld: 152 mg/dL — ABNORMAL HIGH (ref 65–99)
Potassium: 4.5 mmol/L (ref 3.5–5.1)
SODIUM: 136 mmol/L (ref 135–145)

## 2016-11-28 LAB — CULTURE, BLOOD (ROUTINE X 2)

## 2016-11-28 LAB — TROPONIN I: TROPONIN I: 0.15 ng/mL — AB (ref <0.03)

## 2016-11-28 NOTE — Progress Notes (Signed)
Subjective:    Leg swelling improving. SOB improving.   Objective:   Temp:  [98.3 F (36.8 C)-98.5 F (36.9 C)] 98.5 F (36.9 C) (03/24 0500) Pulse Rate:  [72-77] 72 (03/24 1046) Resp:  [18-20] 20 (03/24 0500) BP: (118-130)/(40-64) 124/43 (03/24 1046) SpO2:  [97 %-100 %] 97 % (03/24 1046) Weight:  [237 lb 6.4 oz (107.7 kg)] 237 lb 6.4 oz (107.7 kg) (03/24 0500) Last BM Date: 11/27/16  Filed Weights   11/26/16 0510 11/27/16 0535 11/28/16 0500  Weight: 225 lb 15.5 oz (102.5 kg) 230 lb 12.8 oz (104.7 kg) 237 lb 6.4 oz (107.7 kg)   No intake or output data in the 24 hours ending 11/28/16 1214    Exam:  General: NAD  HEENT: sclera clear, throat clear  Resp: CTAB  Cardiac: RRR, no m/r/g, no jvd  GI: abdomen soft, NT, ND  MSK: no LE edema. Bilateral erhythma LEs  Neuro: no focal deficits  Psych: appropriate affect  Lab Results:  Basic Metabolic Panel:  Recent Labs Lab 11/26/16 1404 11/27/16 0450 11/27/16 0927 11/28/16 0444  NA 134* 134*  --  136  K 3.8 4.0  --  4.5  CL 100* 100*  --  103  CO2 22 23  --  23  GLUCOSE 154* 127*  --  152*  BUN 53* 54*  --  47*  CREATININE 3.00* 2.84*  --  2.52*  CALCIUM 8.0* 7.9*  --  8.0*  MG  --   --  2.1  --     Liver Function Tests:  Recent Labs Lab 11/26/16 1404  AST 30  ALT 30  ALKPHOS 41  BILITOT 1.0  PROT 5.4*  ALBUMIN 2.4*    CBC:  Recent Labs Lab 11/26/16 0333 11/27/16 0450 11/28/16 0444  WBC 12.4* 9.4 11.4*  HGB 9.6* 9.4* 9.1*  HCT 30.0* 29.9* 30.0*  MCV 86.0 86.9 87.0  PLT 179 204 243    Cardiac Enzymes:  Recent Labs Lab 11/25/16 1554 11/25/16 2125 11/26/16 0333  TROPONINI 0.32* 0.40* 0.51*    BNP: No results for input(s): PROBNP in the last 8760 hours.  Coagulation: No results for input(s): INR in the last 168 hours.  ECG:   Medications:   Scheduled Medications: . aspirin  81 mg Oral Daily  . atorvastatin  10 mg Oral Daily  . cefTRIAXone (ROCEPHIN)  IV  2 g  Intravenous Q24H  . cyclobenzaprine  5 mg Oral TID  . doxycycline  100 mg Oral Q12H  . loratadine  10 mg Oral Daily  . metoprolol succinate  100 mg Oral BID  . multivitamin with minerals  1 tablet Oral Daily  . nicotine  14 mg Transdermal Daily  . omega-3 acid ethyl esters  2 g Oral Daily  . PARoxetine  20 mg Oral q morning - 10a  . sodium chloride flush  3 mL Intravenous Q12H     Infusions:   PRN Medications:  sodium chloride, acetaminophen, clonazePAM, diphenhydrAMINE, diphenhydrAMINE-zinc acetate, ondansetron (ZOFRAN) IV, sodium chloride flush     Assessment/Plan   1. LE edema - echo 11/2016 LVEF 09-32%, normal diastolic function. PASP 41. Normal IVC. LA vol index would suggest mild LA enlargement.  - admitted with LE edema, SOB, orthopnea.  - CXR mild edema. BNP 1532 - significant uptrend in Cr with attempted diuresis. Trending down off diuretics. Only prior Cr in system from 2013 at 2.5.  - fairly benign echo, normal IVC. He did have some edema  on CXR, elevated BNP in setting of CKD. Significnat AKI with initial trial of diuresis. Exam today not overally impressive for CHF. No diuretics today. Perhaps leg swelling from celluliitis, low albumin. Swelling looks to be improving.   2. Elevated troponin - supect demand ischemia. Echo normal LVEF no WMAs. EKG without specific ischemic changes - no clear peak, will repeat troponin.  - no chest pain. F/u troponin trend further, at this time would not plan on ischemic testing unless significant uptrend in troponin.   3. Sepsis - streptococcus bacteremia, cellulitiis - management per primary team       Carlyle Dolly, M.D.

## 2016-11-28 NOTE — Progress Notes (Addendum)
CRITICAL VALUE STICKER  CRITICAL VALUE: trop 0.15  RECEIVER (on-site recipient of call): Gilbert Holmes  [place] NOTIFIED: 11/28/16 @ 425 ppm MESSENGER (representative from lab): vincent wilkins  MD NOTIFIED: Charlies Silvers   TIME OF NOTIFICATION: 4:26 pm  5:55 pm

## 2016-11-28 NOTE — Progress Notes (Signed)
Patient ID: Gilbert Holmes, male   DOB: April 08, 1951, 66 y.o.   MRN: [id number]  PROGRESS NOTE    Valente Fosberg  MVE:[id number] DOB: 08/25/1951 DOA: 11/25/2016  PCP: [name], MD   Brief Narrative:  66 y.o.male with past medical history significant for type 2 DM, HTN, HLD, Stage 3 CKD, and tobacco use who presented to [facility] ED on 11/25/2016 for evaluation of worsening dyspnea and lower extremity edema. He has had lower extremity edema for the past 4 weeks. About two weeks ago, he developed assciated pruritis with scratching of his lower extremities. Over the past week, he noticed worsening orthopnea and dyspnea with exertion and he had began sleeping on the couch with 4+ pillows.  He was found to have cellulitis, fluid overload with normal echo, and streptococcus bacteremia in addition to positive strep pneumonia antigen. Cardio is seeing him in consultation.    Assessment & Plan:   Principal Problem: Acute diastolic CHF / Acute respiratory failure with hypoxia  - BNP on admission 1532 - ECHO on this admission showed normal EF - CXR with mild edema - He was initially on diuretics but this is now stopped due to renal insufficiency  - Cardio has seen him in consultation  Active Problems:   Elevated troponin - Initial troponin was elevated at 0.54 with repeat values at 0.32, 0.40, and 0.51 - Likely demand ischemia from CKD - The 12 lead EKG was without acute ischemic changes - ECHO showed normal EF - Per cardio, would not plan on ischemic testing unless significant uptrend in troponin     Sepsis secondary to streptococcus bacteremia and bilateral LE cellulitis / Positive strep pneumonia antigen  - Sepsis criteria met on admission with fever of 10acute re superi3 F, tachycardia, tachypnea, hypoxia of 92% with [place] oxygen suppor - One blood cx showed strep species while another culture showed no growth - Urine strep pneumonia antigen positive  - CXR showed congestive heart  failure with mild bilateral interstitial edema and small bilateral pleural effusions - Pt was initially on azithro and rocephin but stopped azithro and added doxycyline for LE cellulitis  - Repeat blood cx showed no growth - Influenza negative     Acute renal failure superimposed on CKD stage 3 - Baseline creatinine 2.5 in 2013 - Cr on this admission as high as 3.0, likely from sepsis but has improved since and it is down to 2.52 this am - Follow up BMP in am    Anemia of chronic disease - Due to CKD - Hemoglobin stable     Dyslipidemia - Continue Lipitor and omega 3 supplementation     Essential hypertension - Continue metoprolol 100 mg BID    Tobacco abuse - Continue nicotine patch - Counseled on smoking cessation     Depression - Continue paxil     Morbid obesity - Pt counseled on nutrition and diet  - Body mass index is 33.37 kg/m.    Rash upper extremities bilaterally - Not pruritic - Added benadryl cream    DVT prophylaxis: Lovenox subQ Code Status: full code  Family Communication: family at the bedside  Disposition Plan: not yet stable for discharge; hope in next 2-3 days if cellulitis better and if cleared from cardio standpoint    Consultants:   Cardiology, Dr. Carlyle Dolly   Procedures:   ECHO 3/22 - EF 55-60%, left ventricular diastolic function parameters were normal   Antimicrobials:   Azithro 11/25/16 --> 11/26/2016  Doxycycline 11/26/2016 -->  Rocephin 11/25/2016 -->   Subjective: No overnight events.   Objective: Vitals:   11/28/16 0936 11/28/16 1046 11/28/16 1521 11/28/16 1631  BP: 122/64 (!) 124/43 (!) 138/50 (!) 160/47  Pulse:  72 69 69  Resp:   16 17  Temp:   98.7 F (37.1 C)   TempSrc:   Oral   SpO2:  97% 100% 100%  Weight:      Height:       No intake or output data in the 24 hours ending 11/28/16 1851 Filed Weights   11/26/16 0510 11/27/16 0535 11/28/16 0500  Weight: 102.5 kg (225 lb 15.5 oz) 104.7 kg  (230 lb 12.8 oz) 107.7 kg (237 lb 6.4 oz)    Examination:  General exam: Appears calm and comfortable  Respiratory system: Clear to auscultation. Respiratory effort normal. Cardiovascular system: S1 & S2 heard, rate controlled  Gastrointestinal system: Abdomen is nondistended, soft and nontender. No organomegaly or masses felt. Normal bowel sounds heard. Central nervous system: Alert and oriented. No focal neurological deficits. Extremities: LE bilaterally much better but now has some rash on bilateral UE, he says it is not itchy and thinks it started about 1 days ago Skin: No rashes, lesions or ulcers Psychiatry: Judgement and insight appear normal. Mood & affect appropriate.   Data Reviewed: I have personally reviewed following labs and imaging studies  CBC:  Recent Labs Lab 11/25/16 1310 11/26/16 0333 11/27/16 0450 11/28/16 0444  WBC 16.7* 12.4* 9.4 11.4*  NEUTROABS 15.3* 10.0*  --   --   HGB 10.0* 9.6* 9.4* 9.1*  HCT 32.3* 30.0* 29.9* 30.0*  MCV 87.1 86.0 86.9 87.0  PLT 202 179 204 549   Basic Metabolic Panel:  Recent Labs Lab 11/25/16 1310 11/26/16 0333 11/26/16 1404 11/27/16 0450 11/27/16 0927 11/28/16 0444  NA 132* 132* 134* 134*  --  136  K 4.8 4.2 3.8 4.0  --  4.5  CL 100* 99* 100* 100*  --  103  CO2 18* 21* 22 23  --  23  GLUCOSE 160* 147* 154* 127*  --  152*  BUN 36* 47* 53* 54*  --  47*  CREATININE 2.20* 2.91* 3.00* 2.84*  --  2.52*  CALCIUM 8.7* 8.2* 8.0* 7.9*  --  8.0*  MG  --   --   --   --  2.1  --    GFR: Estimated Creatinine Clearance: 35.3 mL/min (A) (by C-G formula based on SCr of 2.52 mg/dL (H)). Liver Function Tests:  Recent Labs Lab 11/26/16 1404  AST 30  ALT 30  ALKPHOS 41  BILITOT 1.0  PROT 5.4*  ALBUMIN 2.4*   No results for input(s): LIPASE, AMYLASE in the last 168 hours. No results for input(s): AMMONIA in the last 168 hours. Coagulation Profile: No results for input(s): INR, PROTIME in the last 168 hours. Cardiac  Enzymes:  Recent Labs Lab 11/25/16 1554 11/25/16 2125 11/26/16 0333 11/28/16 1411  TROPONINI 0.32* 0.40* 0.51* 0.15*   BNP (last 3 results) No results for input(s): PROBNP in the last 8760 hours. HbA1C: No results for input(s): HGBA1C in the last 72 hours. CBG: No results for input(s): GLUCAP in the last 168 hours. Lipid Profile:  Recent Labs  11/27/16 0450  CHOL 108  HDL 13*  LDLCALC 68  TRIG 137  CHOLHDL 8.3   Thyroid Function Tests: No results for input(s): TSH, T4TOTAL, FREET4, T3FREE, THYROIDAB in the last 72 hours. Anemia Panel: No results for input(s): VITAMINB12, FOLATE, FERRITIN, TIBC,  IRON, RETICCTPCT in the last 72 hours. Urine analysis:    Component Value Date/Time   COLORURINE YELLOW 11/25/2016 1447   APPEARANCEUR HAZY (A) 11/25/2016 1447   LABSPEC 1.015 11/25/2016 1447   PHURINE 5.0 11/25/2016 1447   GLUCOSEU NEGATIVE 11/25/2016 1447   HGBUR MODERATE (A) 11/25/2016 1447   BILIRUBINUR NEGATIVE 11/25/2016 1447   KETONESUR 5 (A) 11/25/2016 1447   PROTEINUR 100 (A) 11/25/2016 1447   UROBILINOGEN 0.2 12/25/2011 1850   NITRITE NEGATIVE 11/25/2016 1447   LEUKOCYTESUR NEGATIVE 11/25/2016 1447   Sepsis Labs: _0 (procalcitonin:4,lacticidven:4)  Culture, blood (routine x 2)     Status: None (Preliminary result)   Collection Time: 11/25/16  1:10 PM  Result Value Ref Range Status   Specimen Description BLOOD LEFT ANTECUBITAL  Final   Special Requests BOTTLES DRAWN AEROBIC ONLY 10 CC  Final   Culture NO GROWTH 3 DAYS  Final   Report Status PENDING  Incomplete  Culture, blood (routine x 2)     Status: Abnormal   Collection Time: 11/25/16  1:55 PM  Result Value Ref Range Status   Specimen Description BLOOD LEFT HAND  Final   Special Requests BOTTLES DRAWN AEROBIC AND ANAEROBIC  5CC  Final   Culture  Setup Time   Final   Culture STREPTOCOCCUS GROUP G (A)  Final   Report Status 11/28/2016 FINAL  Final      Susceptibility   Streptococcus group g -  MIC*    CLINDAMYCIN <=0.25 SENSITIVE Sensitive     AMPICILLIN <=0.25 SENSITIVE Sensitive     ERYTHROMYCIN <=0.12 SENSITIVE Sensitive     VANCOMYCIN 0.5 SENSITIVE Sensitive     CEFTRIAXONE <=0.12 SENSITIVE Sensitive     LEVOFLOXACIN 1 SENSITIVE Sensitive     * STREPTOCOCCUS GROUP G  Blood Culture ID Panel (Reflexed)     Status: Abnormal   Collection Time: 11/25/16  1:55 PM  Result Value Ref Range Status   Enterococcus species NOT DETECTED NOT DETECTED Final   Listeria monocytogenes NOT DETECTED NOT DETECTED Final   Staphylococcus species NOT DETECTED NOT DETECTED Final   Staphylococcus aureus NOT DETECTED NOT DETECTED Final   Streptococcus species DETECTED (A) NOT DETECTED Final   Streptococcus agalactiae NOT DETECTED NOT DETECTED Final   Streptococcus pneumoniae NOT DETECTED NOT DETECTED Final   Streptococcus pyogenes NOT DETECTED NOT DETECTED Final   Acinetobacter baumannii NOT DETECTED NOT DETECTED Final   Enterobacteriaceae species NOT DETECTED NOT DETECTED Final   Enterobacter cloacae complex NOT DETECTED NOT DETECTED Final   Escherichia coli NOT DETECTED NOT DETECTED Final   Klebsiella oxytoca NOT DETECTED NOT DETECTED Final   Klebsiella pneumoniae NOT DETECTED NOT DETECTED Final   Proteus species NOT DETECTED NOT DETECTED Final   Serratia marcescens NOT DETECTED NOT DETECTED Final   Haemophilus influenzae NOT DETECTED NOT DETECTED Final   Neisseria meningitidis NOT DETECTED NOT DETECTED Final   Pseudomonas aeruginosa NOT DETECTED NOT DETECTED Final   Candida albicans NOT DETECTED NOT DETECTED Final   Candida glabrata NOT DETECTED NOT DETECTED Final   Candida krusei NOT DETECTED NOT DETECTED Final   Candida parapsilosis NOT DETECTED NOT DETECTED Final   Candida tropicalis NOT DETECTED NOT DETECTED Final  Urine culture     Status: Abnormal   Collection Time: 11/25/16  2:47 PM  Result Value Ref Range Status   Specimen Description URINE, CLEAN CATCH  Final   Special Requests  NONE  Final   Culture <10,000 COLONIES/mL INSIGNIFICANT GROWTH (A)  Final   Report  Status 11/26/2016 FINAL  Final  Culture, blood (routine x 2)     Status: None (Preliminary result)   Collection Time: 11/27/16  4:50 AM  Result Value Ref Range Status   Specimen Description BLOOD RIGHT ARM  Final   Special Requests BOTTLES DRAWN AEROBIC AND ANAEROBIC 10ML  Final   Culture NO GROWTH 1 DAY  Final  Culture, blood (routine x 2)     Status: None (Preliminary result)   Collection Time: 11/27/16  4:54 AM  Result Value Ref Range Status   Specimen Description BLOOD LEFT ARM  Final   Special Requests BOTTLES DRAWN AEROBIC AND ANAEROBIC 5ML  Final   Culture NO GROWTH 1 DAY  Final      Radiology Studies: Dg Chest 2 View Result Date: 11/26/2016  1. Congestive heart failure with mild bilateral interstitial edema and small bilateral pleural effusions . Findings have improved from prior exam. 2.  No retrocardiac abnormality identified as previously questioned.  Dg Chest Portable 1 View Result Date: 11/25/2016 Marked cardiac enlargement. No definite congestive failure although small BILATERAL effusions are seen. Bibasilar atelectasis. Query retrocardiac process. Recommend two-view chest when stable.     Scheduled Meds: . aspirin  81 mg Oral Daily  . atorvastatin  10 mg Oral Daily  . cefTRIAXone (ROCEPHIN)  IV  2 g Intravenous Q24H  . cyclobenzaprine  5 mg Oral TID  . doxycycline  100 mg Oral Q12H  . loratadine  10 mg Oral Daily  . metoprolol succinate  100 mg Oral BID  . multivitamin with minerals  1 tablet Oral Daily  . nicotine  14 mg Transdermal Daily  . omega-3 acid ethyl esters  2 g Oral Daily  . PARoxetine  20 mg Oral q morning - 10a  . sodium chloride flush  3 mL Intravenous Q12H   Continuous Infusions:   LOS: 3 days    Time spent:25 minutes  Greater than 50% of the time spent on counseling and coordinating the care.   Leisa Lenz, MD [facility] Pager  [phone]  If 7PM-7AM, please contact night-coverage [email or web address] Password [facility] 11/28/2016, 6:51 PM

## 2016-11-29 ENCOUNTER — Encounter (HOSPITAL_COMMUNITY): Payer: Self-pay

## 2016-11-29 DIAGNOSIS — L03116 Cellulitis of left lower limb: Secondary | ICD-10-CM

## 2016-11-29 DIAGNOSIS — L03115 Cellulitis of right lower limb: Secondary | ICD-10-CM

## 2016-11-29 LAB — PROCALCITONIN: PROCALCITONIN: 0.98 ng/mL

## 2016-11-29 MED ORDER — DIPHENHYDRAMINE-ZINC ACETATE 2-0.1 % EX CREA
TOPICAL_CREAM | Freq: Two times a day (BID) | CUTANEOUS | Status: DC
  Administered 2016-11-29 (×2): via TOPICAL
  Filled 2016-11-29 (×2): qty 28

## 2016-11-29 NOTE — Progress Notes (Signed)
[facility]                                                                              Patient Demographics  Gilbert Holmes, is a 66 y.o. male, DOB - October 17, 1950, WUJ:[id number]  Admit date - 11/25/2016   Admitting Physician Waldemar Dickens, MD  Outpatient Primary MD for the patient is [name], MD  Outpatient specialists:   LOS - 4  days    Chief Complaint  Patient presents with  . shortness of breath/ edema       Brief summary   66 y.o.male with past medical history significant for type 2 DM, HTN, HLD, Stage 3 CKD, and tobacco use who presented to [facility] ED on 11/25/2016 for evaluation of worsening dyspnea and lower extremity edema. He has had lower extremity edema for the past 4 weeks. About two weeks ago, he developed assciated pruritis with scratching of his lower extremities. Over the past week, he noticed worsening orthopnea and dyspnea with exertion and he had began sleeping on the couch with 4+ pillows.  He was found to have cellulitis, fluid overload with normal echo, and streptococcus bacteremia in addition to positive strep pneumonia antigen.    Assessment & Plan    Principal Problem: Acute diastolic CHF / Acute respiratory failure with hypoxia  - BNP on admission 1532 - ECHO on this admission showed normal EF55-60%, - CXR with mild edema, initially on diuretics but stopped due to renal insufficiency  - Cardiology following  Active Problems: Elevated troponins - Initial troponin was elevated at 0.54 with repeat values at 0.32, 0.40, and 0.51 - Likely demand ischemia from CKD, EKG without acute ischemic changes - ECHO showed normal EF - Per cardiology, would not plan on ischemic testing unless significant uptrend in troponin   Sepsis secondary to streptococcus bacteremia and bilateral LE cellulitis / Positive strep pneumonia antigen  - Sepsis criteria met on admission with fever, tachycardia, tachypnea, hypoxia  - One blood  cx showed strep species while another culture showed no growth - Urine strep pneumonia antigen positive  - CXR showed congestive heart failure with mild bilateral interstitial edema and small bilateral pleural effusions - Pt was initially on azithro and rocephin but stopped azithro and added doxycyline for LE cellulitis.  - Repeat blood cx showed no growth - Influenza negative  - Patient currently concerned about left lower extremity still more swollen than the right lower symmetry, follow Doppler ultrasound to rule out DVT  Acute renal failure superimposed on CKD stage 3 - Baseline creatinine 2.5 in 2013 - Cr on this admission as high as 3.0, likely from sepsis but has improved since and it is down to 2.52 this am - Follow up BMP in am  Anemia of chronic disease - Due to CKD - Hemoglobin stable   Dyslipidemia - Continue Lipitor and omega 3 supplementation   Essential hypertension - Continue metoprolol 100 mg BID  Tobacco abuse - Continue nicotine patch - Counseled on smoking cessation   Depression - Continue paxil   Morbid obesity - Pt counseled on nutrition and diet  - Body mass index is  33.37 kg/m.    Rash upper extremities bilaterally - Not pruritic - Added benadryl cream    Code Status: Full CODE STATUS DVT Prophylaxis:  Lovenox Family Communication: Discussed in detail with the patient, all imaging results, lab results explained to the patient or *   Disposition Plan: Likely DC home in a.m.  Time Spent in minutes  25 minutes  Procedures:  2-D echo  Consultants:   Cardiology  Antimicrobials:   IV Rocephin  Doxycycline   Medications  Scheduled Meds: . aspirin  81 mg Oral Daily  . atorvastatin  10 mg Oral Daily  . cefTRIAXone (ROCEPHIN)  IV  2 g Intravenous Q24H  . cyclobenzaprine  5 mg Oral TID  . diphenhydrAMINE-zinc acetate   Topical BID  . doxycycline  100 mg Oral Q12H  . loratadine  10 mg Oral Daily  . metoprolol  succinate  100 mg Oral BID  . multivitamin with minerals  1 tablet Oral Daily  . nicotine  14 mg Transdermal Daily  . omega-3 acid ethyl esters  2 g Oral Daily  . PARoxetine  20 mg Oral q morning - 10a  . sodium chloride flush  3 mL Intravenous Q12H   Continuous Infusions: PRN Meds:.sodium chloride, acetaminophen, clonazePAM, diphenhydrAMINE, ondansetron (ZOFRAN) IV, sodium chloride flush   Antibiotics   Anti-infectives    Start     Dose/Rate Route Frequency Ordered Stop   11/27/16 1800  doxycycline (VIBRA-TABS) tablet 100 mg     100 mg Oral Every 12 hours 11/27/16 1040     11/26/16 1800  doxycycline (VIBRAMYCIN) 100 mg in dextrose 5 % 250 mL IVPB  Status:  Discontinued     100 mg 125 mL/hr over 120 Minutes Intravenous Every 12 hours 11/26/16 1651 11/27/16 1039   11/26/16 1500  vancomycin (VANCOCIN) 1,250 mg in sodium chloride 0.9 % 250 mL IVPB  Status:  Discontinued     1,250 mg 166.7 mL/hr over 90 Minutes Intravenous Every 24 hours 11/25/16 1355 11/25/16 1815   11/26/16 1430  ceFEPIme (MAXIPIME) 2 g in dextrose 5 % 50 mL IVPB  Status:  Discontinued     2 g 100 mL/hr over 30 Minutes Intravenous  Once 11/25/16 1355 11/25/16 1815   11/26/16 1000  azithromycin (ZITHROMAX) 500 mg in dextrose 5 % 250 mL IVPB  Status:  Discontinued     500 mg 250 mL/hr over 60 Minutes Intravenous Every 24 hours 11/25/16 1815 11/26/16 1650   11/26/16 0823  cefTRIAXone (ROCEPHIN) 2 g in dextrose 5 % 50 mL IVPB     2 g 100 mL/hr over 30 Minutes Intravenous Every 24 hours 11/26/16 0823     11/26/16 0800  cefTRIAXone (ROCEPHIN) 1 g in dextrose 5 % 50 mL IVPB  Status:  Discontinued     1 g 100 mL/hr over 30 Minutes Intravenous Every 24 hours 11/25/16 1815 11/26/16 0823   11/25/16 1330  vancomycin (VANCOCIN) 2,000 mg in sodium chloride 0.9 % 500 mL IVPB     2,000 mg 250 mL/hr over 120 Minutes Intravenous  Once 11/25/16 1325 11/25/16 1747   11/25/16 1330  ceFEPIme (MAXIPIME) 2 g in dextrose 5 % 50 mL IVPB       2 g 100 mL/hr over 30 Minutes Intravenous  Once 11/25/16 1325 11/25/16 1443        Subjective:   Gilbert Holmes was seen and examined today. Patient denies dizziness, chest pain, abdominal pain, N/V/D/C, new weakness, numbess, tingling. No acute events overnight.  Shortness of breath is improving, still having swelling in the lower extremities.  Objective:   Vitals:   11/28/16 1631 11/28/16 2131 11/29/16 0527 11/29/16 0839  BP: (!) 160/47 (!) 154/46 (!) 139/51 (!) 145/65  Pulse: 69 81 67 77  Resp: 17 18 18    Temp:  97.8 F (36.6 C) 97.4 F (36.3 C)   TempSrc:  Oral Oral   SpO2: 100% 94% 99%   Weight:   102.5 kg (225 lb 15.5 oz)   Height:        Intake/Output Summary (Last 24 hours) at 11/29/16 1219 Last data filed at 11/29/16 0555  Gross per 24 hour  Intake              240 ml  Output                0 ml  Net              240 ml     Wt Readings from Last 3 Encounters:  11/29/16 102.5 kg (225 lb 15.5 oz)  12/25/11 97.1 kg (214 lb)     Exam  General: Alert and oriented x 3, NAD  HEENT:    Neck: Supple, no JVD, no masses  Cardiovascular: S1 S2 auscultated, no rubs, murmurs or gallops. Regular rate and rhythm.  Respiratory: Clear to auscultation bilaterally, no wheezing, rales or rhonchi  Gastrointestinal: Soft, nontender, nondistended, + bowel sounds  Ext: no cyanosis clubbing, 1+ edema LLE >RLE  Neuro: AAOx3, Cr N's II- XII. Strength 5/5 upper and lower extremities bilaterally  Skin: Bilateral lower extremity cellulitis  Psych: Normal affect and demeanor, alert and oriented x3    Data Reviewed:  I have personally reviewed following labs and imaging studies  Micro Results Recent Results (from the past 240 hour(s))  Culture, blood (routine x 2)     Status: None (Preliminary result)   Collection Time: 11/25/16  1:10 PM  Result Value Ref Range Status   Specimen Description BLOOD LEFT ANTECUBITAL  Final   Special Requests BOTTLES DRAWN AEROBIC ONLY 10  CC  Final   Culture NO GROWTH 3 DAYS  Final   Report Status PENDING  Incomplete  Culture, blood (routine x 2)     Status: Abnormal   Collection Time: 11/25/16  1:55 PM  Result Value Ref Range Status   Specimen Description BLOOD LEFT HAND  Final   Special Requests BOTTLES DRAWN AEROBIC AND ANAEROBIC  5CC  Final   Culture  Setup Time   Final    GRAM POSITIVE COCCI IN CHAINS IN PAIRS ANAEROBIC BOTTLE ONLY CRITICAL RESULT CALLED TO, READ BACK BY AND VERIFIED WITH: PHARMD H BAIRD [id number] [phone] MLM    Culture STREPTOCOCCUS GROUP G (A)  Final   Report Status 11/28/2016 FINAL  Final   Organism ID, Bacteria STREPTOCOCCUS GROUP G  Final      Susceptibility   Streptococcus group g - MIC*    CLINDAMYCIN <=0.25 SENSITIVE Sensitive     AMPICILLIN <=0.25 SENSITIVE Sensitive     ERYTHROMYCIN <=0.12 SENSITIVE Sensitive     VANCOMYCIN 0.5 SENSITIVE Sensitive     CEFTRIAXONE <=0.12 SENSITIVE Sensitive     LEVOFLOXACIN 1 SENSITIVE Sensitive     * STREPTOCOCCUS GROUP G  Blood Culture ID Panel (Reflexed)     Status: Abnormal   Collection Time: 11/25/16  1:55 PM  Result Value Ref Range Status   Enterococcus species NOT DETECTED NOT DETECTED Final   Listeria monocytogenes NOT DETECTED NOT  DETECTED Final   Staphylococcus species NOT DETECTED NOT DETECTED Final   Staphylococcus aureus NOT DETECTED NOT DETECTED Final   Streptococcus species DETECTED (A) NOT DETECTED Final    Comment: Not Enterococcus species, Streptococcus agalactiae, Streptococcus pyogenes, or Streptococcus pneumoniae. CRITICAL RESULT CALLED TO, READ BACK BY AND VERIFIED WITH: PHARMD H BAIRD [phone] MLM    Streptococcus agalactiae NOT DETECTED NOT DETECTED Final   Streptococcus pneumoniae NOT DETECTED NOT DETECTED Final   Streptococcus pyogenes NOT DETECTED NOT DETECTED Final   Acinetobacter baumannii NOT DETECTED NOT DETECTED Final   Enterobacteriaceae species NOT DETECTED NOT DETECTED Final   Enterobacter cloacae complex NOT  DETECTED NOT DETECTED Final   Escherichia coli NOT DETECTED NOT DETECTED Final   Klebsiella oxytoca NOT DETECTED NOT DETECTED Final   Klebsiella pneumoniae NOT DETECTED NOT DETECTED Final   Proteus species NOT DETECTED NOT DETECTED Final   Serratia marcescens NOT DETECTED NOT DETECTED Final   Haemophilus influenzae NOT DETECTED NOT DETECTED Final   Neisseria meningitidis NOT DETECTED NOT DETECTED Final   Pseudomonas aeruginosa NOT DETECTED NOT DETECTED Final   Candida albicans NOT DETECTED NOT DETECTED Final   Candida glabrata NOT DETECTED NOT DETECTED Final   Candida krusei NOT DETECTED NOT DETECTED Final   Candida parapsilosis NOT DETECTED NOT DETECTED Final   Candida tropicalis NOT DETECTED NOT DETECTED Final  Urine culture     Status: Abnormal   Collection Time: 11/25/16  2:47 PM  Result Value Ref Range Status   Specimen Description URINE, CLEAN CATCH  Final   Special Requests NONE  Final   Culture <10,000 COLONIES/mL INSIGNIFICANT GROWTH (A)  Final   Report Status 11/26/2016 FINAL  Final  Culture, blood (routine x 2)     Status: None (Preliminary result)   Collection Time: 11/27/16  4:50 AM  Result Value Ref Range Status   Specimen Description BLOOD RIGHT ARM  Final   Special Requests BOTTLES DRAWN AEROBIC AND ANAEROBIC 10ML  Final   Culture NO GROWTH 1 DAY  Final   Report Status PENDING  Incomplete  Culture, blood (routine x 2)     Status: None (Preliminary result)   Collection Time: 11/27/16  4:54 AM  Result Value Ref Range Status   Specimen Description BLOOD LEFT ARM  Final   Special Requests BOTTLES DRAWN AEROBIC AND ANAEROBIC 5ML  Final   Culture NO GROWTH 1 DAY  Final   Report Status PENDING  Incomplete    Radiology Reports Dg Chest 2 View  Result Date: 11/26/2016 CLINICAL DATA:  Shortness of breath. EXAM: CHEST  2 VIEW COMPARISON:  11/25/2016. FINDINGS: Cardiomegaly with mild interstitial prominence noted bilaterally. Findings suggest mild CHF. Small bilateral  pleural effusions. No pneumothorax. IMPRESSION: 1. Congestive heart failure with mild bilateral interstitial edema and small bilateral pleural effusions . Findings have improved from prior exam. 2.  No retrocardiac abnormality identified as previously questioned. Electronically Signed   By: Marcello Moores  Register   On: 11/26/2016 07:56   Dg Chest Portable 1 View  Result Date: 11/25/2016 CLINICAL DATA:  Shortness of breath and swelling of the legs for 1 month. EXAM: PORTABLE CHEST 1 VIEW COMPARISON:  None. FINDINGS: The heart is markedly enlarged. Bibasilar subsegmental atelectasis is present. There is prominence of the aortic knob. Small effusions are suspected. There does not appear to be frank edema although asymmetric retrocardiac opacity could reflect LEFT lower lobe process, versus simply opacity due to the enlarged heart shadow. There is no pneumothorax. No osseous findings. IMPRESSION:  Marked cardiac enlargement. No definite congestive failure although small BILATERAL effusions are seen. Bibasilar atelectasis. Query retrocardiac process. Recommend two-view chest when stable. Electronically Signed   By: Staci Righter M.D.   On: 11/25/2016 12:52    Lab Data:  CBC:  Recent Labs Lab 11/25/16 1310 11/26/16 0333 11/27/16 0450 11/28/16 0444  WBC 16.7* 12.4* 9.4 11.4*  NEUTROABS 15.3* 10.0*  --   --   HGB 10.0* 9.6* 9.4* 9.1*  HCT 32.3* 30.0* 29.9* 30.0*  MCV 87.1 86.0 86.9 87.0  PLT 202 179 204 820   Basic Metabolic Panel:  Recent Labs Lab 11/25/16 1310 11/26/16 0333 11/26/16 1404 11/27/16 0450 11/27/16 0927 11/28/16 0444  NA 132* 132* 134* 134*  --  136  K 4.8 4.2 3.8 4.0  --  4.5  CL 100* 99* 100* 100*  --  103  CO2 18* 21* 22 23  --  23  GLUCOSE 160* 147* 154* 127*  --  152*  BUN 36* 47* 53* 54*  --  47*  CREATININE 2.20* 2.91* 3.00* 2.84*  --  2.52*  CALCIUM 8.7* 8.2* 8.0* 7.9*  --  8.0*  MG  --   --   --   --  2.1  --    GFR: Estimated Creatinine Clearance: 34.5 mL/min (A)  (by C-G formula based on SCr of 2.52 mg/dL (H)). Liver Function Tests:  Recent Labs Lab 11/26/16 1404  AST 30  ALT 30  ALKPHOS 41  BILITOT 1.0  PROT 5.4*  ALBUMIN 2.4*   No results for input(s): LIPASE, AMYLASE in the last 168 hours. No results for input(s): AMMONIA in the last 168 hours. Coagulation Profile: No results for input(s): INR, PROTIME in the last 168 hours. Cardiac Enzymes:  Recent Labs Lab 11/25/16 1554 11/25/16 2125 11/26/16 0333 11/28/16 1411  TROPONINI 0.32* 0.40* 0.51* 0.15*   BNP (last 3 results) No results for input(s): PROBNP in the last 8760 hours. HbA1C: No results for input(s): HGBA1C in the last 72 hours. CBG: No results for input(s): GLUCAP in the last 168 hours. Lipid Profile:  Recent Labs  11/27/16 0450  CHOL 108  HDL 13*  LDLCALC 68  TRIG 137  CHOLHDL 8.3   Thyroid Function Tests: No results for input(s): TSH, T4TOTAL, FREET4, T3FREE, THYROIDAB in the last 72 hours. Anemia Panel: No results for input(s): VITAMINB12, FOLATE, FERRITIN, TIBC, IRON, RETICCTPCT in the last 72 hours. Urine analysis:    Component Value Date/Time   COLORURINE YELLOW 11/25/2016 1447   APPEARANCEUR HAZY (A) 11/25/2016 1447   LABSPEC 1.015 11/25/2016 1447   PHURINE 5.0 11/25/2016 1447   GLUCOSEU NEGATIVE 11/25/2016 1447   HGBUR MODERATE (A) 11/25/2016 1447   BILIRUBINUR NEGATIVE 11/25/2016 1447   KETONESUR 5 (A) 11/25/2016 1447   PROTEINUR 100 (A) 11/25/2016 1447   UROBILINOGEN 0.2 12/25/2011 1850   NITRITE NEGATIVE 11/25/2016 1447   LEUKOCYTESUR NEGATIVE 11/25/2016 1447     RAI,RIPUDEEP M.D. [facility] 11/29/2016, 12:19 PM  Pager: [phone] Between 7am to 7pm - call Pager - [phone]  After 7pm go to [email or web address] - password [email or web address]  Call night coverage person covering after 7pm

## 2016-11-29 NOTE — Progress Notes (Signed)
Progress Note  Patient Name: Gilbert Holmes Date of Encounter: 11/29/2016  Primary Cardiologist:  Dr. Harrington Challenger (new)  Subjective   Breathing OK.  No acute complaints.  No pain  Inpatient Medications    Scheduled Meds: . aspirin  81 mg Oral Daily  . atorvastatin  10 mg Oral Daily  . cefTRIAXone (ROCEPHIN)  IV  2 g Intravenous Q24H  . cyclobenzaprine  5 mg Oral TID  . diphenhydrAMINE-zinc acetate   Topical BID  . doxycycline  100 mg Oral Q12H  . loratadine  10 mg Oral Daily  . metoprolol succinate  100 mg Oral BID  . multivitamin with minerals  1 tablet Oral Daily  . nicotine  14 mg Transdermal Daily  . omega-3 acid ethyl esters  2 g Oral Daily  . PARoxetine  20 mg Oral q morning - 10a  . sodium chloride flush  3 mL Intravenous Q12H   Continuous Infusions:  PRN Meds: sodium chloride, acetaminophen, clonazePAM, diphenhydrAMINE, ondansetron (ZOFRAN) IV, sodium chloride flush   Vital Signs    Vitals:   11/28/16 1631 11/28/16 2131 11/29/16 0527 11/29/16 0839  BP: (!) 160/47 (!) 154/46 (!) 139/51 (!) 145/65  Pulse: 69 81 67 77  Resp: 17 18 18    Temp:  97.8 F (36.6 C) 97.4 F (36.3 C)   TempSrc:  Oral Oral   SpO2: 100% 94% 99%   Weight:   225 lb 15.5 oz (102.5 kg)   Height:        Intake/Output Summary (Last 24 hours) at 11/29/16 1044 Last data filed at 11/29/16 0555  Gross per 24 hour  Intake              240 ml  Output                0 ml  Net              240 ml   Filed Weights   11/27/16 0535 11/28/16 0500 11/29/16 0527  Weight: 230 lb 12.8 oz (104.7 kg) 237 lb 6.4 oz (107.7 kg) 225 lb 15.5 oz (102.5 kg)    Telemetry    NSR with run of atrial tach - Personally Reviewed  ECG    NA - Personally Reviewed  Physical Exam   GEN: No acute distress.   Neck: No  JVD Cardiac: RRR, no murmurs, rubs, or gallops.  Respiratory: Clear  to auscultation bilaterally. GI: Soft, nontender, non-distended  MS: Left greater than right leg edema; No deformity. Neuro:   Nonfocal  Psych: Normal affect   Labs    Chemistry Recent Labs Lab 11/26/16 1404 11/27/16 0450 11/28/16 0444  NA 134* 134* 136  K 3.8 4.0 4.5  CL 100* 100* 103  CO2 22 23 23   GLUCOSE 154* 127* 152*  BUN 53* 54* 47*  CREATININE 3.00* 2.84* 2.52*  CALCIUM 8.0* 7.9* 8.0*  PROT 5.4*  --   --   ALBUMIN 2.4*  --   --   AST 30  --   --   ALT 30  --   --   ALKPHOS 41  --   --   BILITOT 1.0  --   --   GFRNONAA 20* 22* 25*  GFRAA 24* 25* 29*  ANIONGAP 12 11 10      Hematology Recent Labs Lab 11/26/16 0333 11/27/16 0450 11/28/16 0444  WBC 12.4* 9.4 11.4*  RBC 3.49* 3.44* 3.45*  HGB 9.6* 9.4* 9.1*  HCT 30.0* 29.9* 30.0*  MCV  86.0 86.9 87.0  MCH 27.5 27.3 26.4  MCHC 32.0 31.4 30.3  RDW 17.6* 17.5* 17.1*  PLT 179 204 243    Cardiac Enzymes Recent Labs Lab 11/25/16 1554 11/25/16 2125 11/26/16 0333 11/28/16 1411  TROPONINI 0.32* 0.40* 0.51* 0.15*    Recent Labs Lab 11/25/16 1318  TROPIPOC 0.54*     BNP Recent Labs Lab 11/25/16 1231  BNP 1,532.6*     DDimer No results for input(s): DDIMER in the last 168 hours.   Radiology    No results found.  Cardiac Studies   ECHO 11/26/16  Study Conclusions  - Left ventricle: The cavity size was normal. There was mild   concentric hypertrophy. Systolic function was normal. The   estimated ejection fraction was in the range of 55% to 60%. Left   ventricular diastolic function parameters were normal. - Left atrium: The atrium was moderately dilated. - Atrial septum: No defect or patent foramen ovale was identified. - Pulmonary arteries: PA peak pressure: 41 mm Hg (S).   Patient Profile     66 y.o. male with past medical history of Type 2 DM, HTN, HLD, Stage 3 CKD, and tobacco use who presented to [facility] ED on 11/25/2016 for evaluation of worsening dyspnea and lower extremity edema.  Assessment & Plan    ACUTE DIASTOLIC  HEART FAILURE:   Backed off on diuretic when creat rose.  This has come back down  holding diuretic.  Continue to hold.  Leg edema that is residual likely related to cellulitis.    HTN:  BP is slightly elevated.  He might need the addition of Norvasc prior to discharge.   ELEVATED TROPONIN:  Trended down.  Thought to be related to demand ischemia.  Not thought to be an ACS.  No further work up in patient.  He can follow with Dr. Harrington Challenger after discharge and she can consider whether there would be any further work up.       Signed, Minus Breeding, MD  11/29/2016, 10:44 AM

## 2016-11-30 ENCOUNTER — Inpatient Hospital Stay (HOSPITAL_COMMUNITY): Payer: Self-pay

## 2016-11-30 DIAGNOSIS — M7989 Other specified soft tissue disorders: Secondary | ICD-10-CM

## 2016-11-30 DIAGNOSIS — L0390 Cellulitis, unspecified: Secondary | ICD-10-CM

## 2016-11-30 LAB — BASIC METABOLIC PANEL
ANION GAP: 10 (ref 5–15)
BUN: 36 mg/dL — ABNORMAL HIGH (ref 6–20)
CHLORIDE: 103 mmol/L (ref 101–111)
CO2: 23 mmol/L (ref 22–32)
Calcium: 8.2 mg/dL — ABNORMAL LOW (ref 8.9–10.3)
Creatinine, Ser: 2.03 mg/dL — ABNORMAL HIGH (ref 0.61–1.24)
GFR calc Af Amer: 38 mL/min — ABNORMAL LOW (ref >60)
GFR calc non Af Amer: 33 mL/min — ABNORMAL LOW (ref >60)
Glucose, Bld: 133 mg/dL — ABNORMAL HIGH (ref 65–99)
POTASSIUM: 4.8 mmol/L (ref 3.5–5.1)
SODIUM: 136 mmol/L (ref 135–145)

## 2016-11-30 LAB — CULTURE, BLOOD (ROUTINE X 2): Culture: NO GROWTH

## 2016-11-30 MED ORDER — METOPROLOL SUCCINATE ER 100 MG PO TB24: 100.0000 mg | ORAL_TABLET | Freq: Two times a day (BID) | ORAL | 3 refills | Status: DC

## 2016-11-30 MED ORDER — DIPHENHYDRAMINE-ZINC ACETATE 2-0.1 % EX CREA: TOPICAL_CREAM | Freq: Two times a day (BID) | CUTANEOUS | 3 refills | Status: DC

## 2016-11-30 MED ORDER — CYCLOBENZAPRINE HCL 5 MG PO TABS: 5.0000 mg | ORAL_TABLET | Freq: Three times a day (TID) | ORAL | 0 refills | Status: DC | PRN

## 2016-11-30 MED ORDER — AMLODIPINE BESYLATE 5 MG PO TABS: 5.0000 mg | ORAL_TABLET | Freq: Every day | ORAL | 3 refills | Status: DC

## 2016-11-30 MED ORDER — DIPHENHYDRAMINE HCL 25 MG PO CAPS: 25.0000 mg | ORAL_CAPSULE | Freq: Four times a day (QID) | ORAL | 0 refills | Status: DC | PRN

## 2016-11-30 MED ORDER — CLINDAMYCIN HCL 300 MG PO CAPS: 300.0000 mg | ORAL_CAPSULE | Freq: Three times a day (TID) | ORAL | 0 refills | Status: DC

## 2016-11-30 MED ORDER — AMLODIPINE BESYLATE 5 MG PO TABS
5.0000 mg | ORAL_TABLET | Freq: Every day | ORAL | Status: DC
  Administered 2016-11-30: 5 mg via ORAL
  Filled 2016-11-30: qty 1

## 2016-11-30 NOTE — Plan of Care (Signed)
Problem: Food- and Nutrition-Related Knowledge Deficit (NB-1.1) Goal: Nutrition education Formal process to instruct or train a patient/client in a skill or to impart knowledge to help patients/clients voluntarily manage or modify food choices and eating behavior to maintain or improve health. Outcome: Completed/Met Date Met: 11/30/16 Nutrition Education Note  RD consulted for nutrition education regarding CHF.  RD provided "Low Sodium Nutrition Therapy" as well as "Heart Healthy Nutrition Therapy" handout from the Academy of [employer]. Reviewed patient's dietary recall. Provided examples on ways to decrease sodium intake in diet. Discouraged intake of processed foods, restaurant/fast foods, and use of salt shaker. Encouraged fresh fruits and vegetables as well as whole grain sources of carbohydrates to maximize fiber intake.   RD discussed why it is important for patient to adhere to diet recommendations, and emphasized the role of fluids and foods to avoid. Teach back method used.  Expect good compliance.  Body mass index is 33.21 kg/m. Pt meets criteria for class I obesity based on current BMI.  Current diet order is heart with 1500 fluids, patient is consuming approximately 90% of meals at this time. Labs and medications reviewed. No further nutrition interventions warranted at this time. RD contact information provided. If additional nutrition issues arise, please re-consult RD. Plans for discharge.    Corrin Parker, MS, RD, LDN Pager # [phone] After hours/ weekend pager # [phone]

## 2016-11-30 NOTE — Progress Notes (Signed)
*  PRELIMINARY RESULTS* Vascular Ultrasound Lower extremity venous duplex has been completed.  Preliminary findings: No evidence of DVT or baker's cyst.   Landry Mellow, RDMS, RVT  11/30/2016, 10:14 AM

## 2016-11-30 NOTE — Discharge Summary (Signed)
Physician Discharge Summary   Patient ID: Gilbert Holmes MRN: [id number] DOB/AGE: 11-29-1950 66 y.o.  Admit date: 11/25/2016 Discharge date: 11/30/2016  Primary Care Physician:  [name], MD  Discharge Diagnoses:    . HTN (hypertension) . Acute diastolic CHF (congestive heart failure) ([place]) . Acute respiratory failure with hypoxia ([place]) . Elevated troponin . Streptococcal sepsis with acute renal failure ([place]) . Bilateral lower leg cellulitis . Acute renal failure superimposed on stage 3 chronic kidney disease ([place]) . Anemia of chronic disease . Depression . Tobacco abuse . Dyslipidemia   Consults:  Cardiology  Recommendations for Outpatient Follow-up:  1. Please repeat CBC/BMET at next visit   DIET: Heart healthy diet    Allergies:   Allergies  Allergen Reactions  . Naproxen Sodium Swelling     DISCHARGE MEDICATIONS: Current Discharge Medication List    START taking these medications   Details  amLODipine (NORVASC) 5 MG tablet Take 1 tablet (5 mg total) by mouth daily. Qty: 30 tablet, Refills: 3    clindamycin (CLEOCIN) 300 MG capsule Take 1 capsule (300 mg total) by mouth 3 (three) times daily. X 7 days Qty: 21 capsule, Refills: 0    cyclobenzaprine (FLEXERIL) 5 MG tablet Take 1 tablet (5 mg total) by mouth 3 (three) times daily as needed for muscle spasms. Qty: 60 tablet, Refills: 0    diphenhydrAMINE (BENADRYL) 25 mg capsule Take 1 capsule (25 mg total) by mouth every 6 (six) hours as needed for itching or allergies. Qty: 30 capsule, Refills: 0    diphenhydrAMINE-zinc acetate (BENADRYL) cream Apply topically 2 (two) times daily. Apply to legs arms chest and back Qty: 28 g, Refills: 3      CONTINUE these medications which have CHANGED   Details  metoprolol succinate (TOPROL-XL) 100 MG 24 hr tablet Take 1 tablet (100 mg total) by mouth 2 (two) times daily. Take with or immediately following a meal. Qty: 60 tablet, Refills: 3      CONTINUE  these medications which have NOT CHANGED   Details  aspirin 81 MG chewable tablet Chew 81 mg by mouth daily.    atorvastatin (LIPITOR) 10 MG tablet Take 10 mg by mouth daily.    clonazePAM (KLONOPIN) 0.5 MG tablet Take 1 mg by mouth 2 (two) times daily as needed for anxiety.     fish oil-omega-3 fatty acids 1000 MG capsule Take 2 g by mouth daily.    loratadine (CLARITIN) 10 MG tablet Take 10 mg by mouth daily.    Multiple Vitamin (MULTIVITAMIN) tablet Take 1 tablet by mouth daily.    PARoxetine (PAXIL) 20 MG tablet Take 20 mg by mouth every morning.      STOP taking these medications     benazepril (LOTENSIN) 10 MG tablet          Brief H and P: For complete details please refer to admission H and P, but in brief 66 y.o.male with past medical history significant for type 2 DM, HTN, HLD, Stage 3 CKD, and tobacco use who presented to [facility] ED on 11/25/2016 for evaluation of worsening dyspnea and lower extremity edema. He has had lower extremity edema for the past 4 weeks. About two weeks ago, he developed assciated pruritis with scratching of his lower extremities. Over the past week, he noticed worsening orthopnea and dyspnea with exertion and he had began sleeping on the couch with 4+ pillows.  He was found to have cellulitis, fluid overload with normal echo, and streptococcus bacteremia  in addition to positive strep pneumonia antigen.   Hospital Course:  Acute diastolic CHF / Acute respiratory failure with hypoxia  - BNP on admission 1532 - ECHO done this admission showed normal EF 55-60%, - CXR with mild edema, initially placed on diuretics but stopped due to creatinine trending up and renal insufficiency . Cardiology followed the patient closely.   Elevated troponins - Initial troponin was elevated at 0.54 with repeat values at 0.32, 0.40, and 0.51 - Likely demand ischemia from CKD, EKG without acute ischemic changes - ECHO showed normal EF - Per cardiology, would  not plan on ischemic testing unless significant uptrend in troponin   Sepsis secondary to streptococcus bacteremia and bilateral LE cellulitis / Positive strep pneumonia antigen  - Sepsis criteria met on admission with fever, tachycardia, tachypnea, hypoxia  - One blood cx showed strep species while another culture showed no growth - Urine strep pneumonia antigen positive  - CXR showed congestive heart failure with mild bilateral interstitial edema and small bilateral pleural effusions - Pt was initially on azithro and rocephin but stopped azithro and added doxycyline for LE cellulitis.  - Repeat blood cx showed no growth - Influenza negative  -Doppler ultrasound of the bilateral lower ext negative for DVT.  - Per sensitivities, antibiotics narrowed down to clindamycin at the time of discharge for 1 week  Acute renal failure superimposed on CKD stage 3 - Baseline creatinine 2.5 in 2013 - Cr on this admission as high as 3.0, likely from sepsis but has improved since and it is down to 2.52 this am. Creatinine now trending down nicely, creatinine 2.0, close to baseline. - Follow BMET outpatient in one week. Follow renal function, and lisinopril on hold.  Anemia of chronic disease - Due to CKD - Hemoglobin stable   Dyslipidemia - Continue Lipitor and omega 3 supplementation   Essential hypertension - Continue metoprolol 100 mg BID, added Norvasc 5 mg daily per cardiology recommendations.  Tobacco abuse - Continue nicotine patch - Counseled on smoking cessation   Depression - Continue paxil   Morbid obesity - Pt counseled on nutrition and diet  - Body mass index is 33.37 kg/m.  Rash upper extremities bilaterally - Not pruritic - Added benadryl cream     Day of Discharge BP (!) 141/62 (BP Location: Right Arm)   Pulse 73   Temp 98.1 F (36.7 C) (Oral)   Resp 18   Ht _0  (1.753 m)   Wt 102 kg (224 lb 14.4 oz)   SpO2 99%   BMI 33.21 kg/m    Physical Exam: General: Alert and awake oriented x3 not in any acute distress. HEENT: anicteric sclera, pupils reactive to light and accommodation CVS: S1-S2 clear no murmur rubs or gallops Chest: clear to auscultation bilaterally, no wheezing rales or rhonchi Abdomen: soft nontender, nondistended, normal bowel sounds Extremities: no cyanosis, clubbing, trace to 1+ edema noted bilaterally, edema and cellulitis improving Neuro: Cranial nerves II-XII intact, no focal neurological deficits   The results of significant diagnostics from this hospitalization (including imaging, microbiology, ancillary and laboratory) are listed below for reference.    LAB RESULTS: Basic Metabolic Panel:  Recent Labs Lab 11/27/16 0927 11/28/16 0444 11/30/16 0523  NA  --  136 136  K  --  4.5 4.8  CL  --  103 103  CO2  --  23 23  GLUCOSE  --  152* 133*  BUN  --  47* 36*  CREATININE  --  2.52* 2.03*  CALCIUM  --  8.0* 8.2*  MG 2.1  --   --    Liver Function Tests:  Recent Labs Lab 11/26/16 1404  AST 30  ALT 30  ALKPHOS 41  BILITOT 1.0  PROT 5.4*  ALBUMIN 2.4*   No results for input(s): LIPASE, AMYLASE in the last 168 hours. No results for input(s): AMMONIA in the last 168 hours. CBC:  Recent Labs Lab 11/26/16 0333 11/27/16 0450 11/28/16 0444  WBC 12.4* 9.4 11.4*  NEUTROABS 10.0*  --   --   HGB 9.6* 9.4* 9.1*  HCT 30.0* 29.9* 30.0*  MCV 86.0 86.9 87.0  PLT 179 204 243   Cardiac Enzymes:  Recent Labs Lab 11/26/16 0333 11/28/16 1411  TROPONINI 0.51* 0.15*   BNP: Invalid input(s): POCBNP CBG: No results for input(s): GLUCAP in the last 168 hours.  Significant Diagnostic Studies:  Dg Chest 2 View  Result Date: 11/26/2016 CLINICAL DATA:  Shortness of breath. EXAM: CHEST  2 VIEW COMPARISON:  11/25/2016. FINDINGS: Cardiomegaly with mild interstitial prominence noted bilaterally. Findings suggest mild CHF. Small bilateral pleural effusions. No pneumothorax. IMPRESSION: 1.  Congestive heart failure with mild bilateral interstitial edema and small bilateral pleural effusions . Findings have improved from prior exam. 2.  No retrocardiac abnormality identified as previously questioned. Electronically Signed   By: Marcello Moores  Register   On: 11/26/2016 07:56   Dg Chest Portable 1 View  Result Date: 11/25/2016 CLINICAL DATA:  Shortness of breath and swelling of the legs for 1 month. EXAM: PORTABLE CHEST 1 VIEW COMPARISON:  None. FINDINGS: The heart is markedly enlarged. Bibasilar subsegmental atelectasis is present. There is prominence of the aortic knob. Small effusions are suspected. There does not appear to be frank edema although asymmetric retrocardiac opacity could reflect LEFT lower lobe process, versus simply opacity due to the enlarged heart shadow. There is no pneumothorax. No osseous findings. IMPRESSION: Marked cardiac enlargement. No definite congestive failure although small BILATERAL effusions are seen. Bibasilar atelectasis. Query retrocardiac process. Recommend two-view chest when stable. Electronically Signed   By: Staci Righter M.D.   On: 11/25/2016 12:52    2D ECHO: Study Conclusions  - Left ventricle: The cavity size was normal. There was mild   concentric hypertrophy. Systolic function was normal. The   estimated ejection fraction was in the range of 55% to 60%. Left   ventricular diastolic function parameters were normal. - Left atrium: The atrium was moderately dilated. - Atrial septum: No defect or patent foramen ovale was identified. - Pulmonary arteries: PA peak pressure: 41 mm Hg (S).   Disposition and Follow-up: Discharge Instructions    (HEART FAILURE PATIENTS) Call MD:  Anytime you have any of the following symptoms: 1) 3 pound weight gain in 24 hours or 5 pounds in 1 week 2) shortness of breath, with or without a dry hacking cough 3) swelling in the hands, feet or stomach 4) if you have to sleep on extra pillows at night in order to breathe.     Complete by:  As directed    Diet - low sodium heart healthy    Complete by:  As directed    Increase activity slowly    Complete by:  As directed        DISPOSITION: home    DISCHARGE FOLLOW-UP Follow-up Information    [name], MD. Schedule an appointment as soon as possible for a visit in 1 week(s).   Specialty:  Family Medicine Why:  needs labs BMET  for renal function in 1 week Contact information: [address] [place]  [address] [phone]        Dorris Carnes, MD. Schedule an appointment as soon as possible for a visit in 2 week(s).   Specialty:  Cardiology Contact information: [address] [place] [address] [phone]            Time spent on Discharge: 2mns    Signed:   Jader Desai M.D. [facility] 11/30/2016, 10:57 AM Pager: [phone]

## 2016-11-30 NOTE — Care Management Note (Signed)
Case Management Note  Patient Details  Name: Emanuell Morina MRN: [id number] Date of Birth: 14-Jul-1951  Subjective/Objective:          Pt admitted CHF, history of 2 DM, HTN, HLD, Stage 3 CKD, and tobacco use. From home alone. Uses a cane intermittently with ambulation.  Sian Joles (Daughter)     [phone]      PCP: Precious Haws  Action/Plan:  Plan is to d/c to to home today. Pt will go to daughter's  for a couple of weeks then transition to home.  Expected Discharge Date:  11/30/16               Expected Discharge Plan:  Home/Self Care  In-House Referral:     Discharge planning Services  CM Consult  Post Acute Care Choice:    Choice offered to:     DME Arranged:    DME Agency:     HH Arranged:    HH Agency:     Status of Service:  Completed, signed off  If discussed at [employer] of Stay Meetings, dates discussed:    Additional Comments: CM received consult : CHF screen. CHF teaching done per nurse . Pt stated will purchase scale and weigh daily. Pt states will f/u with PCP once d/c.  Whitman Hero Chapin, RN 11/30/2016, 12:42 PM

## 2016-11-30 NOTE — Discharge Instructions (Signed)
DASH Eating Plan DASH stands for "Dietary Approaches to Stop Hypertension." The DASH eating plan is a healthy eating plan that has been shown to reduce high blood pressure (hypertension). It may also reduce your risk for type 2 diabetes, heart disease, and stroke. The DASH eating plan may also help with weight loss. What are tips for following this plan? General guidelines  Avoid eating more than 2,300 mg (milligrams) of salt (sodium) a day. If you have hypertension, you may need to reduce your sodium intake to 1,500 mg a day.  Limit alcohol intake to no more than 1 drink a day for nonpregnant women and 2 drinks a day for men. One drink equals 12 oz of beer, 5 oz of wine, or 1 oz of hard liquor.  Work with your health care provider to maintain a healthy body weight or to lose weight. Ask what an ideal weight is for you.  Get at least 30 minutes of exercise that causes your heart to beat faster (aerobic exercise) most days of the week. Activities may include walking, swimming, or biking.  Work with your health care provider or diet and nutrition specialist (dietitian) to adjust your eating plan to your individual calorie needs. Reading food labels  Check food labels for the amount of sodium per serving. Choose foods with less than 5 percent of the Daily Value of sodium. Generally, foods with less than 300 mg of sodium per serving fit into this eating plan.  To find whole grains, look for the word "whole" as the first word in the ingredient list. Shopping  Buy products labeled as "low-sodium" or "no salt added."  Buy fresh foods. Avoid canned foods and premade or frozen meals. Cooking  Avoid adding salt when cooking. Use salt-free seasonings or herbs instead of table salt or sea salt. Check with your health care provider or pharmacist before using salt substitutes.  Do not fry foods. Cook foods using healthy methods such as baking, boiling, grilling, and broiling instead.  Cook with  heart-healthy oils, such as olive, canola, soybean, or sunflower oil. Meal planning   Eat a balanced diet that includes: ? 5 or more servings of fruits and vegetables each day. At each meal, try to fill half of your plate with fruits and vegetables. ? Up to 6-8 servings of whole grains each day. ? Less than 6 oz of lean meat, poultry, or fish each day. A 3-oz serving of meat is about the same size as a deck of cards. One egg equals 1 oz. ? 2 servings of low-fat dairy each day. ? A serving of nuts, seeds, or beans 5 times each week. ? Heart-healthy fats. Healthy fats called Omega-3 fatty acids are found in foods such as flaxseeds and coldwater fish, like sardines, salmon, and mackerel.  Limit how much you eat of the following: ? Canned or prepackaged foods. ? Food that is high in trans fat, such as fried foods. ? Food that is high in saturated fat, such as fatty meat. ? Sweets, desserts, sugary drinks, and other foods with added sugar. ? Full-fat dairy products.  Do not salt foods before eating.  Try to eat at least 2 vegetarian meals each week.  Eat more home-cooked food and less restaurant, buffet, and fast food.  When eating at a restaurant, ask that your food be prepared with less salt or no salt, if possible. What foods are recommended? The items listed may not be a complete list. Talk with your dietitian about what   dietary choices are best for you. Grains Whole-grain or whole-wheat bread. Whole-grain or whole-wheat pasta. Brown rice. Oatmeal. Quinoa. Bulgur. Whole-grain and low-sodium cereals. Pita bread. Low-fat, low-sodium crackers. Whole-wheat flour tortillas. Vegetables Fresh or frozen vegetables (raw, steamed, roasted, or grilled). Low-sodium or reduced-sodium tomato and vegetable juice. Low-sodium or reduced-sodium tomato sauce and tomato paste. Low-sodium or reduced-sodium canned vegetables. Fruits All fresh, dried, or frozen fruit. Canned fruit in natural juice (without  added sugar). Meat and other protein foods Skinless chicken or turkey. Ground chicken or turkey. Pork with fat trimmed off. Fish and seafood. Egg whites. Dried beans, peas, or lentils. Unsalted nuts, nut butters, and seeds. Unsalted canned beans. Lean cuts of beef with fat trimmed off. Low-sodium, lean deli meat. Dairy Low-fat (1%) or fat-free (skim) milk. Fat-free, low-fat, or reduced-fat cheeses. Nonfat, low-sodium ricotta or cottage cheese. Low-fat or nonfat yogurt. Low-fat, low-sodium cheese. Fats and oils Soft margarine without trans fats. Vegetable oil. Low-fat, reduced-fat, or light mayonnaise and salad dressings (reduced-sodium). Canola, safflower, olive, soybean, and sunflower oils. Avocado. Seasoning and other foods Herbs. Spices. Seasoning mixes without salt. Unsalted popcorn and pretzels. Fat-free sweets. What foods are not recommended? The items listed may not be a complete list. Talk with your dietitian about what dietary choices are best for you. Grains Baked goods made with fat, such as croissants, muffins, or some breads. Dry pasta or rice meal packs. Vegetables Creamed or fried vegetables. Vegetables in a cheese sauce. Regular canned vegetables (not low-sodium or reduced-sodium). Regular canned tomato sauce and paste (not low-sodium or reduced-sodium). Regular tomato and vegetable juice (not low-sodium or reduced-sodium). Pickles. Olives. Fruits Canned fruit in a light or heavy syrup. Fried fruit. Fruit in cream or butter sauce. Meat and other protein foods Fatty cuts of meat. Ribs. Fried meat. Bacon. Sausage. Bologna and other processed lunch meats. Salami. Fatback. Hotdogs. Bratwurst. Salted nuts and seeds. Canned beans with added salt. Canned or smoked fish. Whole eggs or egg yolks. Chicken or turkey with skin. Dairy Whole or 2% milk, cream, and half-and-half. Whole or full-fat cream cheese. Whole-fat or sweetened yogurt. Full-fat cheese. Nondairy creamers. Whipped toppings.  Processed cheese and cheese spreads. Fats and oils Butter. Stick margarine. Lard. Shortening. Ghee. Bacon fat. Tropical oils, such as coconut, palm kernel, or palm oil. Seasoning and other foods Salted popcorn and pretzels. Onion salt, garlic salt, seasoned salt, table salt, and sea salt. Worcestershire sauce. Tartar sauce. Barbecue sauce. Teriyaki sauce. Soy sauce, including reduced-sodium. Steak sauce. Canned and packaged gravies. Fish sauce. Oyster sauce. Cocktail sauce. Horseradish that you find on the shelf. Ketchup. Mustard. Meat flavorings and tenderizers. Bouillon cubes. Hot sauce and Tabasco sauce. Premade or packaged marinades. Premade or packaged taco seasonings. Relishes. Regular salad dressings. Where to find more information:  National Heart, Lung, and Blood Institute: www.nhlbi.nih.gov  American Heart Association: www.heart.org Summary  The DASH eating plan is a healthy eating plan that has been shown to reduce high blood pressure (hypertension). It may also reduce your risk for type 2 diabetes, heart disease, and stroke.  With the DASH eating plan, you should limit salt (sodium) intake to 2,300 mg a day. If you have hypertension, you may need to reduce your sodium intake to 1,500 mg a day.  When on the DASH eating plan, aim to eat more fresh fruits and vegetables, whole grains, lean proteins, low-fat dairy, and heart-healthy fats.  Work with your health care provider or diet and nutrition specialist (dietitian) to adjust your eating plan to your individual   calorie needs. This information is not intended to replace advice given to you by your health care provider. Make sure you discuss any questions you have with your health care provider. Document Released: 08/13/2011 Document Revised: 08/17/2016 Document Reviewed: 08/17/2016 Elsevier Interactive Patient Education  2017 [employer]vier Inc.  

## 2016-11-30 NOTE — Progress Notes (Signed)
Emelda Brothers to be D/C'd Home per MD order.  Discussed with the patient and all questions fully answered.  VSS, Skin clean, dry and intact without evidence of skin break down, no evidence of skin tears noted. IV catheter discontinued intact. Site without signs and symptoms of complications. Dressing and pressure applied.  An After Visit Summary was printed and given to the patient. Patient received prescription.  D/c education completed with patient/family including follow up instructions, medication list, d/c activities limitations if indicated, with other d/c instructions as indicated by MD - patient able to verbalize understanding, all questions fully answered.   Patient instructed to return to ED, call 911, or call MD for any changes in condition.   Patient escorted via [place], and D/C home via private auto.  Barbar Brede O Aliceson Dolbow 11/30/2016 1:21 PM

## 2016-12-02 LAB — CULTURE, BLOOD (ROUTINE X 2)
CULTURE: NO GROWTH
Culture: NO GROWTH

## 2016-12-02 LAB — HEMOGLOBIN A1C
HEMOGLOBIN A1C: 5.4 % (ref 4.8–5.6)
Mean Plasma Glucose: 108 mg/dL

## 2016-12-07 DIAGNOSIS — Z09 Encounter for follow-up examination after completed treatment for conditions other than malignant neoplasm: Secondary | ICD-10-CM

## 2016-12-07 DIAGNOSIS — N184 Chronic kidney disease, stage 4 (severe): Secondary | ICD-10-CM

## 2016-12-07 DIAGNOSIS — I5032 Chronic diastolic (congestive) heart failure: Secondary | ICD-10-CM

## 2016-12-07 HISTORY — DX: Chronic kidney disease, stage 4 (severe): N18.4

## 2016-12-07 HISTORY — DX: Encounter for follow-up examination after completed treatment for conditions other than malignant neoplasm: Z09

## 2016-12-07 HISTORY — DX: Chronic diastolic (congestive) heart failure: I50.32

## 2016-12-11 NOTE — Progress Notes (Signed)
Cardiology Office Note    Date:  12/14/2016   ID:  Gilbert Holmes, DOB 1951/01/11, MRN [id number]  PCP:  [name], MD  Cardiologist:  Dr. Harrington Challenger  Chief Complaint: Hospital follow up for CHF  History of Present Illness:   Gilbert Holmes is a 66 y.o. male with past medical history of Type 2 DM, HTN, HLD, Stage 3 CKD, and tobacco abuse presents for hospital follow up.   Came to [facility] ED on 11/25/2016 for evaluation of worsening dyspnea and lower extremity edema. Admitted for acute diastolic CHF and sepsis 2nd to streptococcus bacteremia (1/2) and bilateral LE cellulitis /leukocytosis.  Echo showed normal LV function of 55-60%, concentric hypertrophy, PA pressure of 64mm Hg. Low albumin contrib to tendency to retain. Troponin was elevated flat trent with peak of 0.54. Felt demand in setting of acute illness. Initially placed on diuretics but stopped due to creatinine trending up and renal insufficiency.  Lisinopril held.   Here today for follow up. Seen by PCP after discharge. Renal function stable. Eating low sodium diet. Denies chest pain, shortness of breath, orthopnea, PND, syncope, LE edema, palpitations. Complains of intermitted itching on legs--> minimal improvement on Benadryl cream.    Past Medical History:  Diagnosis Date  . Arthritis   . Cellulitis and abscess of lower extremity 11/2016  . CKD (chronic kidney disease) stage 3, GFR 30-59 ml/min   . Diabetes mellitus   . DOE (dyspnea on exertion) 11/2016  . Hyperlipidemia   . Hypertension     Past Surgical History:  Procedure Laterality Date  . ANKLE FRACTURE SURGERY      Current Medications: Prior to Admission medications   Medication Sig Start Date End Date Taking? Authorizing Provider  amLODipine (NORVASC) 5 MG tablet Take 1 tablet (5 mg total) by mouth daily. 11/30/16   Ripudeep Krystal Eaton, MD  aspirin 81 MG chewable tablet Chew 81 mg by mouth daily.    Historical Provider, MD  atorvastatin (LIPITOR) 10 MG  tablet Take 10 mg by mouth daily.    Historical Provider, MD  clindamycin (CLEOCIN) 300 MG capsule Take 1 capsule (300 mg total) by mouth 3 (three) times daily. X 7 days 11/30/16   Ripudeep Krystal Eaton, MD  clonazePAM (KLONOPIN) 0.5 MG tablet Take 1 mg by mouth 2 (two) times daily as needed for anxiety.     Historical Provider, MD  cyclobenzaprine (FLEXERIL) 5 MG tablet Take 1 tablet (5 mg total) by mouth 3 (three) times daily as needed for muscle spasms. 11/30/16   Ripudeep Krystal Eaton, MD  diphenhydrAMINE (BENADRYL) 25 mg capsule Take 1 capsule (25 mg total) by mouth every 6 (six) hours as needed for itching or allergies. 11/30/16   Ripudeep Krystal Eaton, MD  diphenhydrAMINE-zinc acetate (BENADRYL) cream Apply topically 2 (two) times daily. Apply to legs arms chest and back 11/30/16   Ripudeep K Rai, MD  fish oil-omega-3 fatty acids 1000 MG capsule Take 2 g by mouth daily.    Historical Provider, MD  loratadine (CLARITIN) 10 MG tablet Take 10 mg by mouth daily.    Historical Provider, MD  metoprolol succinate (TOPROL-XL) 100 MG 24 hr tablet Take 1 tablet (100 mg total) by mouth 2 (two) times daily. Take with or immediately following a meal. 11/30/16   Ripudeep Krystal Eaton, MD  Multiple Vitamin (MULTIVITAMIN) tablet Take 1 tablet by mouth daily.    Historical Provider, MD  PARoxetine (PAXIL) 20 MG tablet Take 20 mg by mouth every morning.  Historical Provider, MD    Allergies:   Naproxen sodium and Yellow jacket venom [bee venom]   Social History   Social History  . Marital status: Divorced    Spouse name: N/A  . Number of children: N/A  . Years of education: N/A   Social History Main Topics  . Smoking status: Current Every Day Smoker    Packs/day: 1.50    Years: 30.00    Types: Cigarettes  . Smokeless tobacco: Never Used  . Alcohol use No  . Drug use: No  . Sexual activity: Not Asked   Other Topics Concern  . None   Social History Narrative  . None     Family History:  The patient's family history  includes CAD in his brother; Cancer in his mother; Heart disease in his father; Hypertension in his father.   ROS:   Please see the history of present illness.    ROS All other systems reviewed and are negative.   PHYSICAL EXAM:   VS:  BP 130/80   Pulse (!) 54   Ht 5\' 9"  (1.753 m)   Wt 211 lb 12.8 oz (96.1 kg)   BMI 31.28 kg/m    GEN: Well nourished, well developed, in no acute distress  HEENT: normal  Neck: no JVD, carotid bruits, or masses Cardiac: RRR; no murmurs, rubs, or gallops, Trace edema with mild redness, no warmth, Scratching Mark Respiratory:  clear to auscultation bilaterally, normal work of breathing GI: soft, nontender, nondistended, + BS MS: no deformity or atrophy  Skin: warm and dry, no rash Neuro:  Alert and Oriented x 3, Strength and sensation are intact Psych: euthymic mood, full affect  Wt Readings from Last 3 Encounters:  12/14/16 211 lb 12.8 oz (96.1 kg)  11/30/16 224 lb 14.4 oz (102 kg)  12/25/11 214 lb (97.1 kg)      Studies/Labs Reviewed:   EKG:  EKG is not ordered today.    Recent Labs: 11/25/2016: B Natriuretic Peptide 1,532.6 11/26/2016: ALT 30 11/27/2016: Magnesium 2.1 11/28/2016: Hemoglobin 9.1; Platelets 243 11/30/2016: BUN 36; Creatinine, Ser 2.03; Potassium 4.8; Sodium 136   Lipid Panel    Component Value Date/Time   CHOL 108 11/27/2016 0450   TRIG 137 11/27/2016 0450   HDL 13 (L) 11/27/2016 0450   CHOLHDL 8.3 11/27/2016 0450   VLDL 27 11/27/2016 0450   LDLCALC 68 11/27/2016 0450    Additional studies/ records that were reviewed today include:   Echocardiogram: 11/26/16 Study Conclusions  - Left ventricle: The cavity size was normal. There was mild   concentric hypertrophy. Systolic function was normal. The   estimated ejection fraction was in the range of 55% to 60%. Left   ventricular diastolic function parameters were normal. - Left atrium: The atrium was moderately dilated. - Atrial septum: No defect or patent foramen  ovale was identified. - Pulmonary arteries: PA peak pressure: 41 mm Hg (S).     ASSESSMENT & PLAN:    1. Chronic diastolic CHF - Combination of CHF and acute respiratory failure with hypoxia. BNP was 1532. Echo with normal EF. His breathing has been normal. Denies orthopnea, PND or syncope.  2. LE edema - felt likely due to cellulitis. Trace edema noted. Advice leg elevation and compression stocking. Try over-the-counter steroid medication for itching.  3. HTN - Normal on current medication  4. CKD, stage III - Baseline creatinine around 2.5. Back to baseline per patient.  5. Tobacco abuse - He has cut back on  tobacco smoking to half a pack a day from 2.5 pack a day. Advise complete cessation.    Medication Adjustments/Labs and Tests Ordered: Current medicines are reviewed at length with the patient today.  Concerns regarding medicines are outlined above.  Medication changes, Labs and Tests ordered today are listed in the Patient Instructions below. Patient Instructions  Medication Instructions:  Your physician recommends that you continue on your current medications as directed. Please refer to the Current Medication list given to you today.   Labwork: None ordered  Testing/Procedures: None ordered  Follow-Up: Your physician recommends that you schedule a follow-up appointment in:    Any Other Special Instructions Will Be Listed Below (If Applicable).     If you need a refill on your cardiac medications before your next appointment, please call your pharmacy.       Jarrett Soho, [place]  12/14/2016 1:50 PM    [facility] [address], [place], [place]  [address] Phone: [phone]; Fax: [phone]

## 2016-12-14 ENCOUNTER — Encounter (INDEPENDENT_AMBULATORY_CARE_PROVIDER_SITE_OTHER): Payer: Self-pay

## 2016-12-14 ENCOUNTER — Encounter: Payer: Self-pay | Admitting: Physician Assistant

## 2016-12-14 ENCOUNTER — Ambulatory Visit (INDEPENDENT_AMBULATORY_CARE_PROVIDER_SITE_OTHER): Payer: Self-pay | Admitting: Physician Assistant

## 2016-12-14 VITALS — BP 130/80 | HR 54 | Ht 69.0 in | Wt 211.8 lb

## 2016-12-14 DIAGNOSIS — Z720 Tobacco use: Secondary | ICD-10-CM

## 2016-12-14 DIAGNOSIS — R600 Localized edema: Secondary | ICD-10-CM

## 2016-12-14 DIAGNOSIS — I10 Essential (primary) hypertension: Secondary | ICD-10-CM

## 2016-12-14 DIAGNOSIS — I5032 Chronic diastolic (congestive) heart failure: Secondary | ICD-10-CM

## 2016-12-14 NOTE — Patient Instructions (Addendum)
Medication Instructions:  Your physician recommends that you continue on your current medications as directed. Please refer to the Current Medication list given to you today.   Labwork: None ordered  Testing/Procedures: None ordered  Follow-Up: Your physician wants you to follow-up in: 6 MONTHS WITH DR. ROSS  You will receive a reminder letter in the mail two months in advance. If you don't receive a letter, please call our office to schedule the follow-up appointment.   Any Other Special Instructions Will Be Listed Below (If Applicable).     If you need a refill on your cardiac medications before your next appointment, please call your pharmacy.   

## 2017-10-13 ENCOUNTER — Telehealth: Payer: Self-pay | Admitting: Physician Assistant

## 2017-10-13 NOTE — Telephone Encounter (Signed)
Left message with Aniceto Boss at [facility] to call back and patient was last seen on 12/2016.

## 2017-10-13 NOTE — Telephone Encounter (Signed)
New message   Gilbert Holmes at [facility] states that the patient said the cardiologist told him that he doesn't have heart failure and that its pneumonia and she is trying to confirm because she does not see it in any notes in Epic Please call

## 2017-10-15 NOTE — Telephone Encounter (Signed)
Non one has called back - will await a return call and address it then, should they need further information.

## 2018-02-26 IMAGING — DX DG CHEST 2V
2 series · 2 of 2 positions shown · non-contrast
Comparison: 11/25/2016.

CLINICAL DATA: Shortness of breath.

EXAM:
CHEST  2 VIEW

[chest ap]
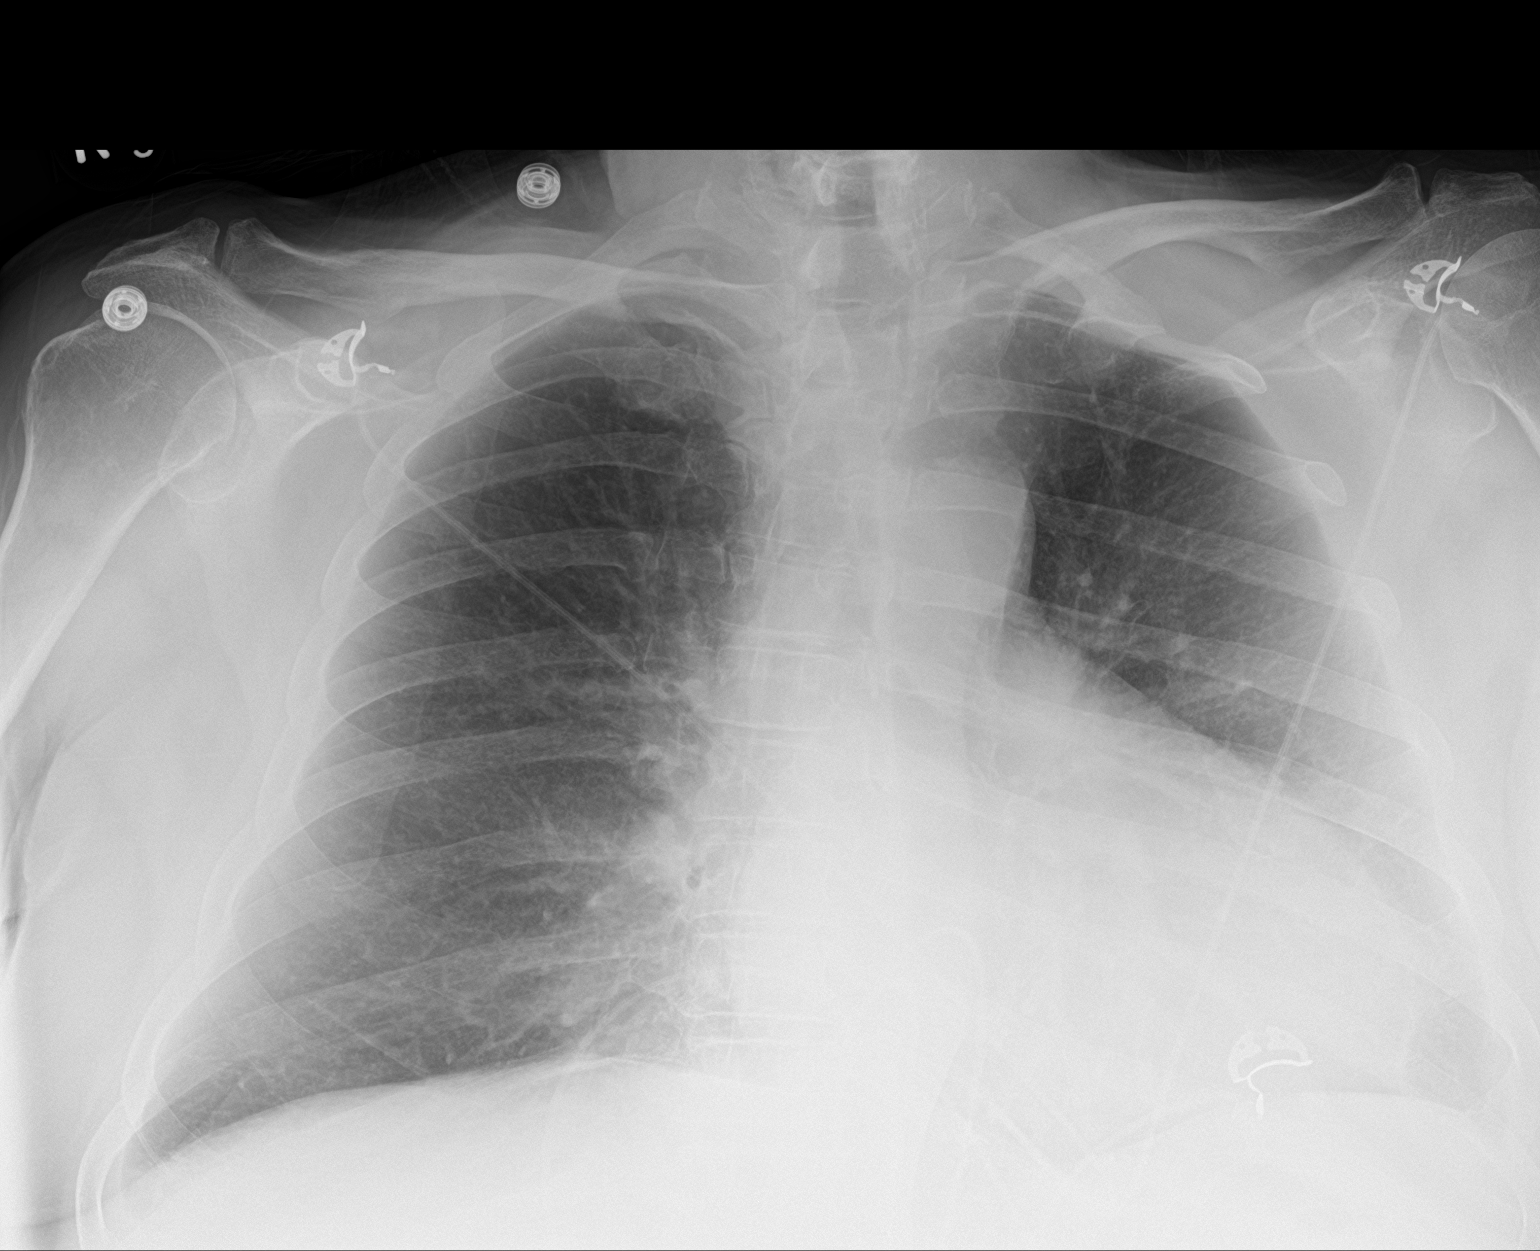

[chest lat]
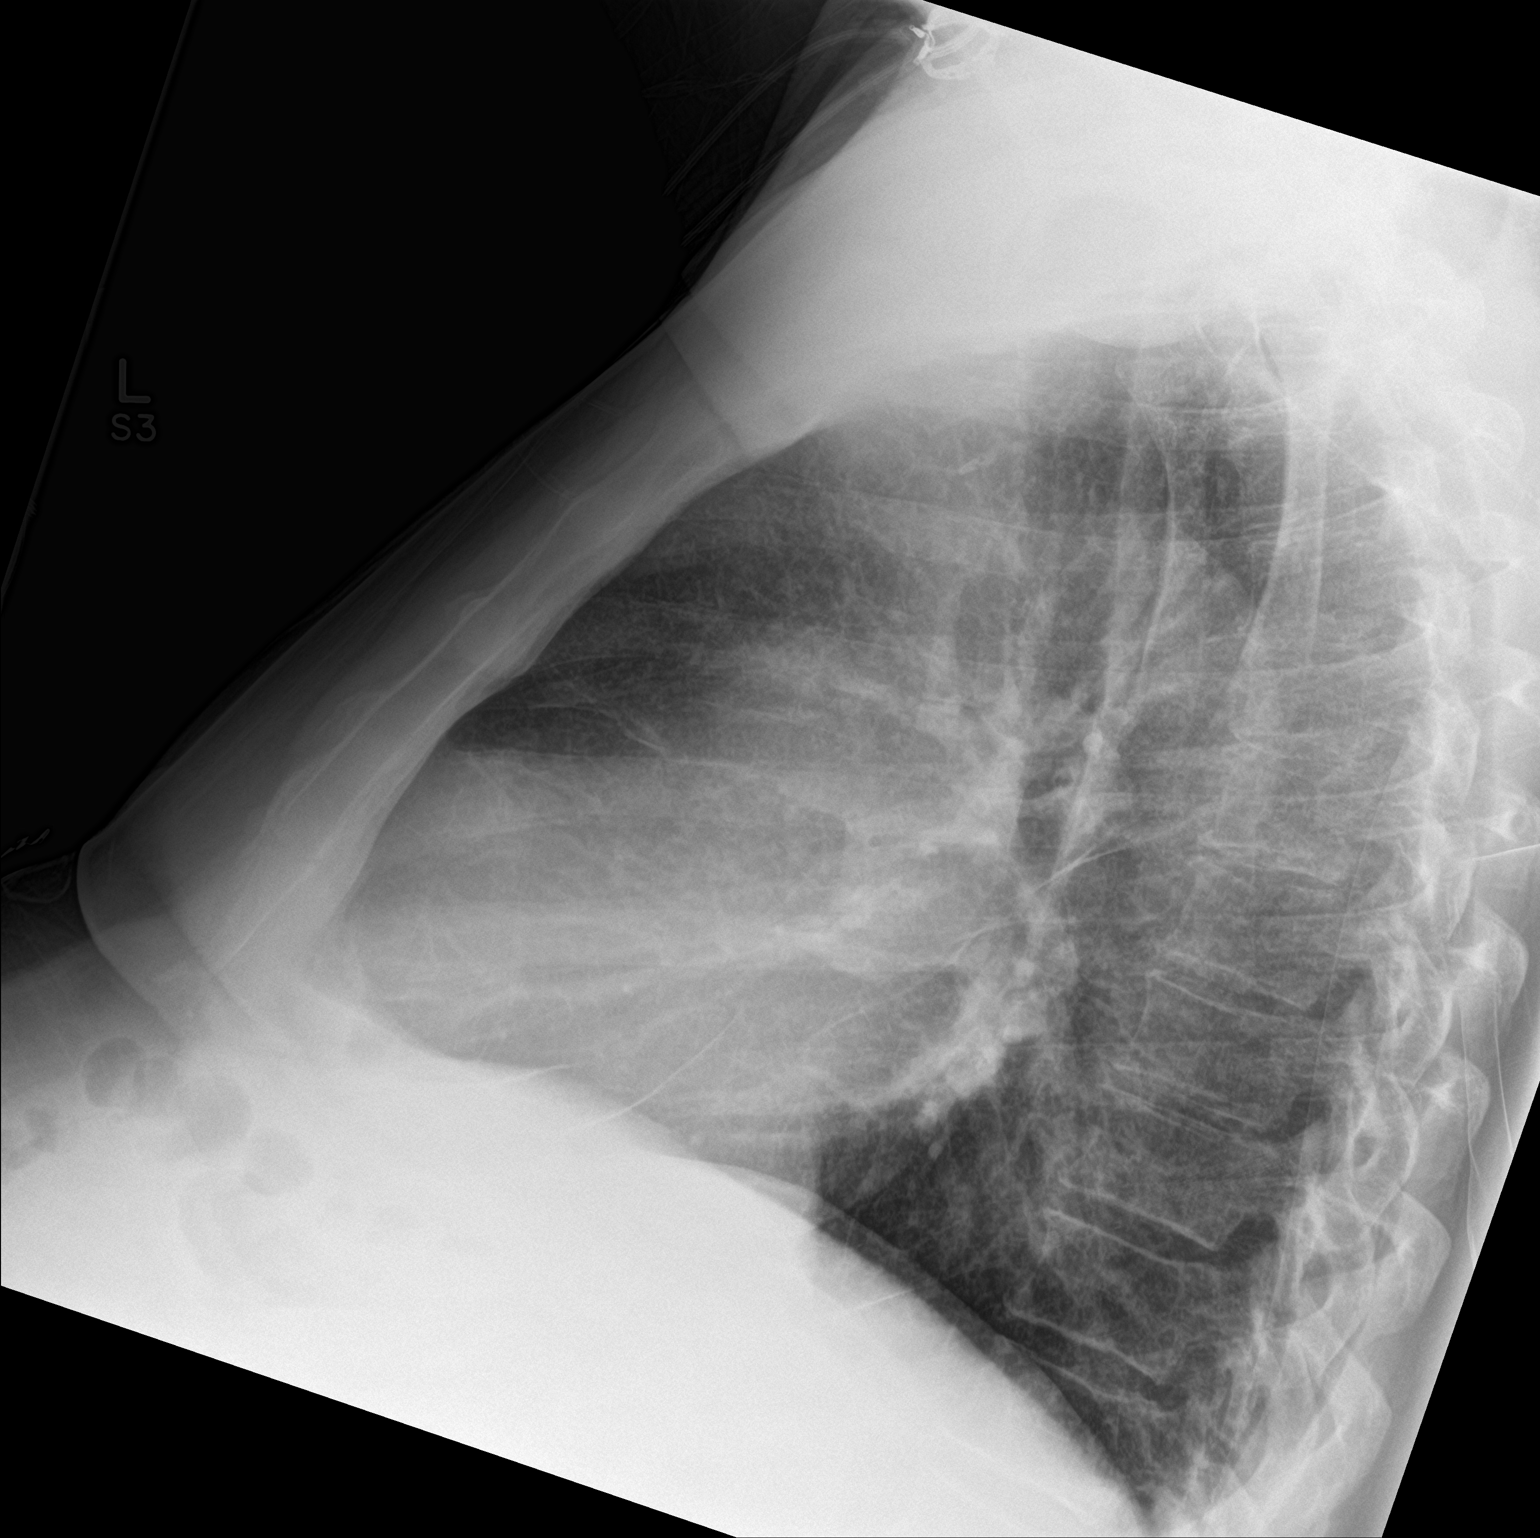

[2 of 2 positions shown; findings below may reference images not displayed]

FINDINGS: Cardiomegaly with mild interstitial prominence noted bilaterally.
Findings suggest mild CHF. Small bilateral pleural effusions. No
pneumothorax.
IMPRESSION: 1. Congestive heart failure with mild bilateral interstitial edema
and small bilateral pleural effusions . Findings have improved from
prior exam.

2.  No retrocardiac abnormality identified as previously questioned.

## 2018-08-15 ENCOUNTER — Emergency Department (HOSPITAL_COMMUNITY): Payer: Self-pay

## 2018-08-15 ENCOUNTER — Encounter (HOSPITAL_COMMUNITY): Payer: Self-pay | Admitting: Emergency Medicine

## 2018-08-15 ENCOUNTER — Observation Stay (HOSPITAL_COMMUNITY)
Admission: EM | Admit: 2018-08-15 | Discharge: 2018-08-16 | Disposition: A | Discharge status: Home / Self Care | Payer: Self-pay | Attending: Family Medicine | Admitting: Family Medicine

## 2018-08-15 DIAGNOSIS — R42 Dizziness and giddiness: Secondary | ICD-10-CM | POA: Insufficient documentation

## 2018-08-15 DIAGNOSIS — R112 Nausea with vomiting, unspecified: Secondary | ICD-10-CM | POA: Insufficient documentation

## 2018-08-15 DIAGNOSIS — Z7982 Long term (current) use of aspirin: Secondary | ICD-10-CM | POA: Insufficient documentation

## 2018-08-15 DIAGNOSIS — I10 Essential (primary) hypertension: Secondary | ICD-10-CM

## 2018-08-15 DIAGNOSIS — Z7984 Long term (current) use of oral hypoglycemic drugs: Secondary | ICD-10-CM | POA: Insufficient documentation

## 2018-08-15 DIAGNOSIS — I129 Hypertensive chronic kidney disease with stage 1 through stage 4 chronic kidney disease, or unspecified chronic kidney disease: Secondary | ICD-10-CM | POA: Insufficient documentation

## 2018-08-15 DIAGNOSIS — N183 Chronic kidney disease, stage 3 unspecified: Secondary | ICD-10-CM | POA: Diagnosis present

## 2018-08-15 DIAGNOSIS — E1122 Type 2 diabetes mellitus with diabetic chronic kidney disease: Secondary | ICD-10-CM | POA: Insufficient documentation

## 2018-08-15 DIAGNOSIS — Z79899 Other long term (current) drug therapy: Secondary | ICD-10-CM | POA: Insufficient documentation

## 2018-08-15 DIAGNOSIS — E785 Hyperlipidemia, unspecified: Secondary | ICD-10-CM | POA: Insufficient documentation

## 2018-08-15 DIAGNOSIS — I160 Hypertensive urgency: Principal | ICD-10-CM | POA: Diagnosis present

## 2018-08-15 DIAGNOSIS — R9431 Abnormal electrocardiogram [ECG] [EKG]: Secondary | ICD-10-CM | POA: Insufficient documentation

## 2018-08-15 LAB — CBC WITH DIFFERENTIAL/PLATELET
Abs Immature Granulocytes: 0.08 10*3/uL — ABNORMAL HIGH (ref 0.00–0.07)
BASOS PCT: 0 %
Basophils Absolute: 0 10*3/uL (ref 0.0–0.1)
Eosinophils Absolute: 0.1 10*3/uL (ref 0.0–0.5)
Eosinophils Relative: 1 %
HCT: 42.8 % (ref 39.0–52.0)
Hemoglobin: 13.1 g/dL (ref 13.0–17.0)
Immature Granulocytes: 1 %
Lymphocytes Relative: 11 %
Lymphs Abs: 1.4 10*3/uL (ref 0.7–4.0)
MCH: 27.9 pg (ref 26.0–34.0)
MCHC: 30.6 g/dL (ref 30.0–36.0)
MCV: 91.3 fL (ref 80.0–100.0)
Monocytes Absolute: 0.7 10*3/uL (ref 0.1–1.0)
Monocytes Relative: 6 %
Neutro Abs: 10.3 10*3/uL — ABNORMAL HIGH (ref 1.7–7.7)
Neutrophils Relative %: 81 %
Platelets: 171 10*3/uL (ref 150–400)
RBC: 4.69 MIL/uL (ref 4.22–5.81)
RDW: 14 % (ref 11.5–15.5)
WBC: 12.6 10*3/uL — AB (ref 4.0–10.5)
nRBC: 0 % (ref 0.0–0.2)

## 2018-08-15 LAB — TROPONIN I: Troponin I: <0.03 ng/mL (ref <0.03)

## 2018-08-15 LAB — COMPREHENSIVE METABOLIC PANEL
ALT: 14 U/L (ref 0–44)
AST: 16 U/L (ref 15–41)
Albumin: 3.4 g/dL — ABNORMAL LOW (ref 3.5–5.0)
Alkaline Phosphatase: 52 U/L (ref 38–126)
Anion gap: 14 (ref 5–15)
BUN: 36 mg/dL — ABNORMAL HIGH (ref 8–23)
CO2: 21 mmol/L — ABNORMAL LOW (ref 22–32)
Calcium: 8.5 mg/dL — ABNORMAL LOW (ref 8.9–10.3)
Chloride: 102 mmol/L (ref 98–111)
Creatinine, Ser: 2.42 mg/dL — ABNORMAL HIGH (ref 0.61–1.24)
GFR calc Af Amer: 31 mL/min — ABNORMAL LOW (ref >60)
GFR calc non Af Amer: 27 mL/min — ABNORMAL LOW (ref >60)
Glucose, Bld: 137 mg/dL — ABNORMAL HIGH (ref 70–99)
POTASSIUM: 4 mmol/L (ref 3.5–5.1)
SODIUM: 137 mmol/L (ref 135–145)
Total Bilirubin: 0.7 mg/dL (ref 0.3–1.2)
Total Protein: 6.9 g/dL (ref 6.5–8.1)

## 2018-08-15 MED ORDER — HYDRALAZINE HCL 20 MG/ML IJ SOLN
5.0000 mg | Freq: Once | INTRAMUSCULAR | Status: AC
  Administered 2018-08-15: 5 mg via INTRAVENOUS
  Filled 2018-08-15: qty 1

## 2018-08-15 NOTE — ED Triage Notes (Signed)
BIB EMS from home, pt reports onset of dizziness, N/V approx 2000. Checked BP and noted to be hypertensive, 202'R systolic. Given 4mg  Zofran en route.

## 2018-08-15 NOTE — ED Provider Notes (Signed)
[facility] EMERGENCY DEPARTMENT Provider Note   CSN: [id number] Arrival date & time: 08/15/18  2115     History   Chief Complaint Chief Complaint  Patient presents with  . Hypertension    HPI Gilbert Holmes is a 67 y.o. male.   Hypertension  This is a recurrent problem. The current episode started less than 1 hour ago. The problem occurs constantly. The problem has not changed since onset.Pertinent negatives include no chest pain, no abdominal pain, no headaches and no shortness of breath. Nothing aggravates the symptoms. Nothing relieves the symptoms. He has tried nothing for the symptoms. The treatment provided no relief.    Past Medical History:  Diagnosis Date  . Arthritis   . Cellulitis and abscess of lower extremity 11/2016  . CKD (chronic kidney disease) stage 3, GFR 30-59 ml/min (HCC)   . Diabetes mellitus   . DOE (dyspnea on exertion) 11/2016  . Hyperlipidemia   . Hypertension     Patient Active Problem List   Diagnosis Date Noted  . Acute diastolic CHF (congestive heart failure) ([place]) 11/28/2016  . Acute respiratory failure with hypoxia ([place]) 11/28/2016  . Streptococcal sepsis with acute renal failure ([place]) 11/28/2016  . Bilateral lower leg cellulitis 11/28/2016  . Acute renal failure superimposed on stage 3 chronic kidney disease ([place]) 11/28/2016  . Anemia of chronic disease 11/28/2016  . Depression 11/28/2016  . Tobacco abuse 11/28/2016  . Dyslipidemia 11/28/2016  . HTN (hypertension) 11/25/2016  . Elevated troponin     Past Surgical History:  Procedure Laterality Date  . ANKLE FRACTURE SURGERY          Home Medications    Prior to Admission medications   Medication Sig Start Date End Date Taking? Authorizing Provider  amLODipine (NORVASC) 5 MG tablet Take 1 tablet (5 mg total) by mouth daily. Patient taking differently: Take 5 mg by mouth at bedtime.  11/30/16  Yes Rai, Ripudeep Raliegh Ip, MD  aspirin 81 MG chewable tablet Chew 81 mg  by mouth 3 (three) times a week.    Yes [provider]  atorvastatin (LIPITOR) 10 MG tablet Take 10 mg by mouth at bedtime.    Yes [provider]  clonazePAM (KLONOPIN) 1 MG tablet Take 1 mg by mouth 2 (two) times daily as needed for anxiety.   Yes [provider]  fish oil-omega-3 fatty acids 1000 MG capsule Take 1 g by mouth 2 (two) times daily.    Yes [provider]  glipiZIDE (GLUCOTROL) 5 MG tablet Take 5 mg by mouth daily as needed (30 minutes prior to a large meal being consumed).  09/21/16  Yes [provider]  loratadine (CLARITIN) 10 MG tablet Take 10 mg by mouth daily.   Yes [provider]  metoprolol tartrate (LOPRESSOR) 100 MG tablet Take 100 mg by mouth 2 (two) times daily. 05/27/18  Yes [provider]  Multiple Vitamins-Minerals (ONE-A-DAY MENS 50+ ADVANTAGE) TABS Take 1 tablet by mouth daily.   Yes [provider]  PARoxetine (PAXIL) 20 MG tablet Take 20 mg by mouth every morning.   Yes [provider]  traMADol (ULTRAM) 50 MG tablet Take 50-100 mg by mouth See admin instructions. Take 50-100 mg by mouth every other day as needed for pain 04/12/18  Yes [provider]  cyclobenzaprine (FLEXERIL) 5 MG tablet Take 1 tablet (5 mg total) by mouth 3 (three) times daily as needed for muscle spasms. Patient not taking: Reported on 08/15/2018 11/30/16  Rai, Vernelle Emerald, MD  diphenhydrAMINE (BENADRYL) 25 mg capsule Take 1 capsule (25 mg total) by mouth every 6 (six) hours as needed for itching or allergies. Patient not taking: Reported on 08/15/2018 11/30/16   Rai, Vernelle Emerald, MD  diphenhydrAMINE-zinc acetate (BENADRYL) cream Apply topically 2 (two) times daily. Apply to legs arms chest and back Patient not taking: Reported on 08/15/2018 11/30/16   Rai, Vernelle Emerald, MD  metoprolol succinate (TOPROL-XL) 100 MG 24 hr tablet Take 1 tablet (100 mg total) by mouth 2 (two) times daily. Take with or immediately  following a meal. Patient not taking: Reported on 08/15/2018 11/30/16   Mendel Corning, MD    Family History Family History  Problem Relation Age of Onset  . CAD Brother        CABG in his 79's.   . Cancer Mother   . Heart disease Father   . Hypertension Father     Social History Social History   Tobacco Use  . Smoking status: Current Every Day Smoker    Packs/day: 1.50    Years: 30.00    Pack years: 45.00    Types: Cigarettes  . Smokeless tobacco: Never Used  Substance Use Topics  . Alcohol use: No  . Drug use: No     Allergies   Naproxen sodium and Yellow jacket venom [bee venom]   Review of Systems Review of Systems  Respiratory: Negative for shortness of breath.   Cardiovascular: Negative for chest pain.  Gastrointestinal: Positive for nausea and vomiting. Negative for abdominal pain.  Neurological: Positive for dizziness. Negative for headaches.  All other systems reviewed and are negative.    Physical Exam Updated Vital Signs BP (!) 198/75   Pulse (!) 58   Temp 97.6 F (36.4 C) (Oral)   Resp 19   SpO2 92%   Physical Exam  Constitutional: He is oriented to person, place, and time. He appears well-developed and well-nourished.  HENT:  Head: Normocephalic and atraumatic.  Eyes: Conjunctivae and EOM are normal.  Neck: Normal range of motion.  Cardiovascular: Normal rate.  Pulmonary/Chest: Effort normal. No respiratory distress.  Abdominal: Soft. Bowel sounds are normal. He exhibits no distension.  Musculoskeletal: Normal range of motion. He exhibits no edema or deformity.  Neurological: He is alert and oriented to person, place, and time. No cranial nerve deficit. Coordination normal.  Skin: Skin is warm and dry.  Nursing note and vitals reviewed.    ED Treatments / Results  Labs (all labs ordered are listed, but only abnormal results are displayed) Labs Reviewed  CBC WITH DIFFERENTIAL/PLATELET - Abnormal; Notable for the following components:       Result Value   WBC 12.6 (*)    Neutro Abs 10.3 (*)    Abs Immature Granulocytes 0.08 (*)    All other components within normal limits  COMPREHENSIVE METABOLIC PANEL - Abnormal; Notable for the following components:   CO2 21 (*)    Glucose, Bld 137 (*)    BUN 36 (*)    Creatinine, Ser 2.42 (*)    Calcium 8.5 (*)    Albumin 3.4 (*)    GFR calc non Af Amer 27 (*)    GFR calc Af Amer 31 (*)    All other components within normal limits  TROPONIN I    EKG EKG Interpretation  Date/Time:  Monday August 15 2018 21:44:44 EST Ventricular Rate:  56 PR Interval:    QRS Duration: 102 QT Interval:  455 QTC  Calculation: 440 R Axis:   32 Text Interpretation:  Sinus rhythm Anteroseptal infarct, old Borderline repolarization abnormality new TWI in inferior and lateral leads since 11/2016 Confirmed by Merrily Pew [phone]) on 08/15/2018 9:48:48 PM   Radiology Ct Head Wo Contrast  Result Date: 08/15/2018 CLINICAL DATA:  67 y/o M; new onset dizziness, nausea, vomiting. Hypertension. EXAM: CT HEAD WITHOUT CONTRAST TECHNIQUE: Contiguous axial images were obtained from the base of the skull through the vertex without intravenous contrast. COMPARISON:  None. FINDINGS: Brain: No evidence of acute infarction, hemorrhage, hydrocephalus, extra-axial collection or mass lesion/mass effect. 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 Nonspecific white matter hypodensities are compatible with moderate chronic microvascular ischemic changes for age. Moderate volume loss of the brain. Vascular: Calcific atherosclerosis of carotid siphons. No hyperdense vessel identified. Skull: Normal. Negative for fracture or focal lesion. Sinuses/Orbits: Mild paranasal sinus mucosal thickening. Right maxillary sinus fluid level. Chronic inflammatory changes of the walls of the right maxillary sinus. Normal aeration of bilateral mastoid air cells. Orbits are unremarkable. Other: None. IMPRESSION: 1. No acute  intracranial abnormality identified. 2. Moderate chronic microvascular ischemic changes and volume loss of the brain for age. 3. Paranasal sinus disease with a small right maxillary sinus fluid level which may represent acute sinusitis in the appropriate clinical setting. Electronically Signed   By: Kristine Garbe M.D.   On: 08/15/2018 22:17    Procedures Procedures (including critical care time)  Medications Ordered in ED Medications  hydrALAZINE (APRESOLINE) injection 5 mg (has no administration in time range)     Initial Impression / Assessment and Plan / ED Course  I have reviewed the triage vital signs and the nursing notes.  Pertinent labs & imaging results that were available during my care of the patient were reviewed by me and considered in my medical decision making (see chart for details).    New ecg changes but no e/o ischemia necessarily. No CP/SOB to suggest the same. No other neuro deficits to suggest stroke.   Will likely need admit for BP control and further management/workup for ecg change. Discussed with Dr. Alcario Drought who will admit.   Final Clinical Impressions(s) / ED Diagnoses   Final diagnoses:  None    ED Discharge Orders    None       Ashly Goethe, Corene Cornea, MD 08/17/18 [phone]

## 2018-08-16 ENCOUNTER — Encounter (HOSPITAL_COMMUNITY): Payer: Self-pay | Admitting: General Practice

## 2018-08-16 ENCOUNTER — Other Ambulatory Visit: Payer: Self-pay

## 2018-08-16 DIAGNOSIS — I160 Hypertensive urgency: Secondary | ICD-10-CM

## 2018-08-16 DIAGNOSIS — N183 Chronic kidney disease, stage 3 unspecified: Secondary | ICD-10-CM | POA: Diagnosis present

## 2018-08-16 DIAGNOSIS — I10 Essential (primary) hypertension: Secondary | ICD-10-CM

## 2018-08-16 HISTORY — DX: Hypertensive urgency: I16.0

## 2018-08-16 LAB — BASIC METABOLIC PANEL
ANION GAP: 13 (ref 5–15)
BUN: 34 mg/dL — AB (ref 8–23)
CO2: 22 mmol/L (ref 22–32)
Calcium: 8.7 mg/dL — ABNORMAL LOW (ref 8.9–10.3)
Chloride: 103 mmol/L (ref 98–111)
Creatinine, Ser: 2.5 mg/dL — ABNORMAL HIGH (ref 0.61–1.24)
GFR calc Af Amer: 30 mL/min — ABNORMAL LOW (ref >60)
GFR calc non Af Amer: 26 mL/min — ABNORMAL LOW (ref >60)
GLUCOSE: 124 mg/dL — AB (ref 70–99)
Potassium: 3.9 mmol/L (ref 3.5–5.1)
Sodium: 138 mmol/L (ref 135–145)

## 2018-08-16 LAB — TROPONIN I
Troponin I: <0.03 ng/mL (ref <0.03)
Troponin I: <0.03 ng/mL (ref <0.03)

## 2018-08-16 LAB — CBG MONITORING, ED: Glucose-Capillary: 127 mg/dL — ABNORMAL HIGH (ref 70–99)

## 2018-08-16 LAB — HIV ANTIBODY (ROUTINE TESTING W REFLEX): HIV Screen 4th Generation wRfx: NONREACTIVE

## 2018-08-16 MED ORDER — METOCLOPRAMIDE HCL 5 MG/ML IJ SOLN
10.0000 mg | Freq: Once | INTRAMUSCULAR | Status: AC
  Administered 2018-08-16: 10 mg via INTRAVENOUS
  Filled 2018-08-16: qty 2

## 2018-08-16 MED ORDER — ADULT MULTIVITAMIN W/MINERALS CH
1.0000 | ORAL_TABLET | Freq: Every day | ORAL | Status: DC
  Administered 2018-08-16: 1 via ORAL
  Filled 2018-08-16: qty 1

## 2018-08-16 MED ORDER — ENOXAPARIN SODIUM 40 MG/0.4ML ~~LOC~~ SOLN: 40.0000 mg | Freq: Every day | SUBCUTANEOUS | Status: DC

## 2018-08-16 MED ORDER — ACETAMINOPHEN 650 MG RE SUPP: 650.0000 mg | Freq: Four times a day (QID) | RECTAL | Status: DC | PRN

## 2018-08-16 MED ORDER — LORATADINE 10 MG PO TABS
10.0000 mg | ORAL_TABLET | Freq: Every day | ORAL | Status: DC
  Administered 2018-08-16: 10 mg via ORAL
  Filled 2018-08-16: qty 1

## 2018-08-16 MED ORDER — HYDRALAZINE HCL 20 MG/ML IJ SOLN
10.0000 mg | INTRAMUSCULAR | Status: DC | PRN
  Administered 2018-08-16 (×2): 20 mg via INTRAVENOUS
  Administered 2018-08-16: 10 mg via INTRAVENOUS
  Filled 2018-08-16 (×3): qty 1

## 2018-08-16 MED ORDER — ONDANSETRON HCL 4 MG/2ML IJ SOLN: 4.0000 mg | Freq: Four times a day (QID) | INTRAMUSCULAR | Status: DC | PRN

## 2018-08-16 MED ORDER — ASPIRIN 81 MG PO CHEW: 81.0000 mg | CHEWABLE_TABLET | ORAL | Status: DC

## 2018-08-16 MED ORDER — CLONAZEPAM 1 MG PO TABS
1.0000 mg | ORAL_TABLET | Freq: Two times a day (BID) | ORAL | Status: DC | PRN
  Administered 2018-08-16: 1 mg via ORAL
  Filled 2018-08-16: qty 2

## 2018-08-16 MED ORDER — ACETAMINOPHEN 325 MG PO TABS: 650.0000 mg | ORAL_TABLET | Freq: Four times a day (QID) | ORAL | Status: DC | PRN

## 2018-08-16 MED ORDER — AMLODIPINE BESYLATE 10 MG PO TABS: 10.0000 mg | ORAL_TABLET | Freq: Every day | ORAL | Status: DC

## 2018-08-16 MED ORDER — ONDANSETRON HCL 4 MG PO TABS: 4.0000 mg | ORAL_TABLET | Freq: Four times a day (QID) | ORAL | Status: DC | PRN

## 2018-08-16 MED ORDER — ATORVASTATIN CALCIUM 10 MG PO TABS
10.0000 mg | ORAL_TABLET | Freq: Every day | ORAL | Status: DC
  Administered 2018-08-16: 10 mg via ORAL
  Filled 2018-08-16: qty 1

## 2018-08-16 MED ORDER — METOPROLOL TARTRATE 50 MG PO TABS
100.0000 mg | ORAL_TABLET | Freq: Two times a day (BID) | ORAL | Status: DC
  Administered 2018-08-16 (×2): 100 mg via ORAL
  Filled 2018-08-16: qty 2
  Filled 2018-08-16: qty 4

## 2018-08-16 MED ORDER — GUAIFENESIN 100 MG/5ML PO SOLN: 5.0000 mL | ORAL | Status: DC | PRN

## 2018-08-16 MED ORDER — PAROXETINE HCL 20 MG PO TABS
20.0000 mg | ORAL_TABLET | Freq: Every day | ORAL | Status: DC
  Administered 2018-08-16: 20 mg via ORAL
  Filled 2018-08-16 (×2): qty 1

## 2018-08-16 MED ORDER — AMLODIPINE BESYLATE 5 MG PO TABS
5.0000 mg | ORAL_TABLET | Freq: Every day | ORAL | Status: DC
  Administered 2018-08-16: 5 mg via ORAL
  Filled 2018-08-16: qty 1

## 2018-08-16 NOTE — H&P (Signed)
History and Physical    Gilbert Holmes GMW:[id number] DOB: 03/30/1951 DOA: 08/15/2018  PCP: Gilbert Bill, MD  Patient coming from: Home  I have personally briefly reviewed patient's old medical records in [place]  Chief Complaint: Dizziness, N/V  HPI: Gilbert Holmes is a 67 y.o. male with medical history significant of HTN, CKD stage 3, HLD.  Patient had onset of dizziness, N/V at 8pm.  BP checked and noted to be in the 200s which is unusually high for him, (he says he usually runs 120s-130s) so he presented to the ED.  Took evening BP meds.  No CP, no SOB.  No focal neuro deficits.   ED Course: BP initially 440H systolic.  Given hydralazine.  Given another dose of amlodipine 5mg  as well.  BP now down to 165/73 Symptoms resolved completely at this time he says.   Review of Systems: As per HPI otherwise 10 point review of systems negative.   Past Medical History:  Diagnosis Date  . Arthritis   . Cellulitis and abscess of lower extremity 11/2016  . CKD (chronic kidney disease) stage 3, GFR 30-59 ml/min (HCC)   . Diabetes mellitus   . DOE (dyspnea on exertion) 11/2016  . Hyperlipidemia   . Hypertension     Past Surgical History:  Procedure Laterality Date  . ANKLE FRACTURE SURGERY       reports that he has been smoking cigarettes. He has a 45.00 pack-year smoking history. He has never used smokeless tobacco. He reports that he does not drink alcohol or use drugs.  Allergies  Allergen Reactions  . Naproxen Sodium Swelling    Face became swollen  . Yellow Jacket Venom [Bee Venom] Anaphylaxis, Hives and Rash    Family History  Problem Relation Age of Onset  . CAD Brother        CABG in his 1's.   . Cancer Mother   . Heart disease Father   . Hypertension Father      Prior to Admission medications   Medication Sig Start Date End Date Taking? Authorizing Provider  amLODipine (NORVASC) 5 MG tablet Take 1 tablet (5 mg total) by mouth daily. Patient  taking differently: Take 5 mg by mouth at bedtime.  11/30/16  Yes Gilbert Holmes, Gilbert Raliegh Ip, MD  aspirin 81 MG chewable tablet Chew 81 mg by mouth 3 (three) times a week.    Yes [provider]  atorvastatin (LIPITOR) 10 MG tablet Take 10 mg by mouth at bedtime.    Yes [provider]  clonazePAM (KLONOPIN) 1 MG tablet Take 1 mg by mouth 2 (two) times daily as needed for anxiety.   Yes [provider]  fish oil-omega-3 fatty acids 1000 MG capsule Take 1 g by mouth 2 (two) times daily.    Yes [provider]  glipiZIDE (GLUCOTROL) 5 MG tablet Take 5 mg by mouth daily as needed (30 minutes prior to a large meal being consumed).  09/21/16  Yes [provider]  loratadine (CLARITIN) 10 MG tablet Take 10 mg by mouth daily.   Yes [provider]  metoprolol tartrate (LOPRESSOR) 100 MG tablet Take 100 mg by mouth 2 (two) times daily. 05/27/18  Yes [provider]  Multiple Vitamins-Minerals (ONE-A-DAY MENS 50+ ADVANTAGE) TABS Take 1 tablet by mouth daily.   Yes [provider]  PARoxetine (PAXIL) 20 MG tablet Take 20 mg by mouth every morning.   Yes [provider]  traMADol (ULTRAM) 50 MG tablet Take  50-100 mg by mouth See admin instructions. Take 50-100 mg by mouth every other day as needed for pain 04/12/18  Yes [provider]    Physical Exam: Vitals:   08/16/18 0400 08/16/18 0415 08/16/18 0430 08/16/18 0445  BP: (!) 175/70 (!) 156/74 (!) 168/66 (!) 165/73  Pulse: 71 65 63 63  Resp:  17    Temp:      TempSrc:      SpO2: 93% 91% 92% 92%    Constitutional: NAD, calm, comfortable Eyes: PERRL, lids and conjunctivae normal ENMT: Mucous membranes are moist. Posterior pharynx clear of any exudate or lesions.Normal dentition.  Neck: normal, supple, no masses, no thyromegaly Respiratory: clear to auscultation bilaterally, no wheezing, no crackles. Normal respiratory effort. No accessory muscle use.  Cardiovascular: Regular  rate and rhythm, no murmurs / rubs / gallops. No extremity edema. 2+ pedal pulses. No carotid bruits.  Abdomen: no tenderness, no masses palpated. No hepatosplenomegaly. Bowel sounds positive.  Musculoskeletal: no clubbing / cyanosis. No joint deformity upper and lower extremities. Good ROM, no contractures. Normal muscle tone.  Skin: no rashes, lesions, ulcers. No induration Neurologic: CN 2-12 grossly intact. Sensation intact, DTR normal. Strength 5/5 in all 4.  Psychiatric: Normal judgment and insight. Alert and oriented x 3. Normal mood.    Labs on Admission: I have personally reviewed following labs and imaging studies  CBC: Recent Labs  Lab 08/15/18 2216  WBC 12.6*  NEUTROABS 10.3*  HGB 13.1  HCT 42.8  MCV 91.3  PLT 235   Basic Metabolic Panel: Recent Labs  Lab 08/15/18 2216  NA 137  K 4.0  CL 102  CO2 21*  GLUCOSE 137*  BUN 36*  CREATININE 2.42*  CALCIUM 8.5*   GFR: CrCl cannot be calculated (Unknown ideal weight.). Liver Function Tests: Recent Labs  Lab 08/15/18 2216  AST 16  ALT 14  ALKPHOS 52  BILITOT 0.7  PROT 6.9  ALBUMIN 3.4*   No results for input(s): LIPASE, AMYLASE in the last 168 hours. No results for input(s): AMMONIA in the last 168 hours. Coagulation Profile: No results for input(s): INR, PROTIME in the last 168 hours. Cardiac Enzymes: Recent Labs  Lab 08/15/18 2216 08/16/18 0120  TROPONINI <0.03 <0.03   BNP (last 3 results) No results for input(s): PROBNP in the last 8760 hours. HbA1C: No results for input(s): HGBA1C in the last 72 hours. CBG: No results for input(s): GLUCAP in the last 168 hours. Lipid Profile: No results for input(s): CHOL, HDL, LDLCALC, TRIG, CHOLHDL, LDLDIRECT in the last 72 hours. Thyroid Function Tests: No results for input(s): TSH, T4TOTAL, FREET4, T3FREE, THYROIDAB in the last 72 hours. Anemia Panel: No results for input(s): VITAMINB12, FOLATE, FERRITIN, TIBC, IRON, RETICCTPCT in the last 72  hours. Urine analysis:    Component Value Date/Time   COLORURINE YELLOW 11/25/2016 1447   APPEARANCEUR HAZY (A) 11/25/2016 1447   LABSPEC 1.015 11/25/2016 1447   PHURINE 5.0 11/25/2016 1447   GLUCOSEU NEGATIVE 11/25/2016 1447   HGBUR MODERATE (A) 11/25/2016 1447   BILIRUBINUR NEGATIVE 11/25/2016 1447   KETONESUR 5 (A) 11/25/2016 1447   PROTEINUR 100 (A) 11/25/2016 1447   UROBILINOGEN 0.2 12/25/2011 1850   NITRITE NEGATIVE 11/25/2016 1447   LEUKOCYTESUR NEGATIVE 11/25/2016 1447    Radiological Exams on Admission: Dg Chest 2 View  Result Date: 08/15/2018 CLINICAL DATA:  Hypertension. Dizziness for 1 day. Smoker. EXAM: CHEST - 2 VIEW COMPARISON:  11/26/2016. FINDINGS: Stable enlarged cardiac silhouette. Clear lungs with normal vascularity.  Minimal thoracic spine degenerative changes. IMPRESSION: No acute abnormality.  Stable cardiomegaly. Electronically Signed   By: Claudie Revering M.D.   On: 08/15/2018 23:47   Ct Head Wo Contrast  Result Date: 08/15/2018 CLINICAL DATA:  67 y/o M; new onset dizziness, nausea, vomiting. Hypertension. EXAM: CT HEAD WITHOUT CONTRAST TECHNIQUE: Contiguous axial images were obtained from the base of the skull through the vertex without intravenous contrast. COMPARISON:  None. FINDINGS: Brain: No evidence of acute infarction, hemorrhage, hydrocephalus, extra-axial collection or mass lesion/mass effect. 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 Nonspecific white matter hypodensities are compatible with moderate chronic microvascular ischemic changes for age. Moderate volume loss of the brain. Vascular: Calcific atherosclerosis of carotid siphons. No hyperdense vessel identified. Skull: Normal. Negative for fracture or focal lesion. Sinuses/Orbits: Mild paranasal sinus mucosal thickening. Right maxillary sinus fluid level. Chronic inflammatory changes of the walls of the right maxillary sinus. Normal aeration of bilateral mastoid air cells. Orbits are  unremarkable. Other: None. IMPRESSION: 1. No acute intracranial abnormality identified. 2. Moderate chronic microvascular ischemic changes and volume loss of the brain for age. 3. Paranasal sinus disease with a small right maxillary sinus fluid level which may represent acute sinusitis in the appropriate clinical setting. Electronically Signed   By: Kristine Garbe M.D.   On: 08/15/2018 22:17    EKG: Independently reviewed.  Assessment/Plan Principal Problem:   Hypertensive urgency Active Problems:   CKD (chronic kidney disease) stage 3, GFR 30-59 ml/min (HCC)    1. HTN urgency -  1. Continue metoprolol 100mg  BID 2. Increase norvasc to 10mg  QHS 3. Hydralazine PRN 4. BP improved to 165/73 from 200/95 on presentation, symptoms resolved at this time 5. Serial trops 6. Tele monitor 2. CKD stage 3 - 1. Chronic and stable 2. PCP wants him to get established with nephrologist but pt presently uninsured per office visit note.  DVT prophylaxis: Lovenox Code Status: Full Family Communication: No family in room Disposition Plan: Home after admit Consults called: None Admission status: Place in obs   [place], [facility] Pager [phone] Only works nights!  If 7AM-7PM, please contact the primary day team physician taking care of patient  [email or web address] Password [email or web address]  08/16/2018, 5:03 AM

## 2018-08-16 NOTE — ED Notes (Signed)
ED Provider at bedside. 

## 2018-08-16 NOTE — Discharge Summary (Signed)
Physician Discharge Summary  Gilbert Holmes [id number]:[id number] DOB: 02-20-1951 DOA: 08/15/2018  PCP: [name], MD  Admit date: 08/15/2018 Discharge date: 08/16/2018  Recommendations for Outpatient Follow-up:   Abnormal EKG --EKGs independently reviewed, most recently 1315.  Independent review shows sinus rhythm, anterior infarct, old, inferolateral ST depression, suspect repolarization abnormality. --Given the above, there is no reason to suspect acute EKG changes or ACS.  No further inpatient evaluation is suggested. --Follow-up as an outpatient   Follow-up Information    [name], MD. Schedule an appointment as soon as possible for a visit in 1 week(s).   Specialty:  Family Medicine Contact information: [place] [address] [phone]            Discharge Diagnoses:  1. Hypertensive urgency with dizziness, n/v PRINCIPAL 2. Abnormal EKG 3. Essential HTN 4. DM type 2 5. CKD stage III 6. Hyperlipidemia  Discharge Condition: improved  Disposition: home  Diet recommendation: heart healthy  Filed Weights   08/16/18 0744  Weight: 101.6 kg    History of present illness:  67yom PMH HTN, CKD stage III presented with dizziness, n/v, SBP 200s, unusually high per patient. Symptoms resolved in ED with hydralazine and reduction in BP to 165/73.  Hospital Course:  Patient was observed overnight, blood pressure remained stable, patient symptoms resolved with no recurrence.  In further discussion, the patient missed a dose of metoprolol yesterday and has presentation may have been related to rebound hypertension.  Troponins were negative and CT head was nonacute.  Chronic kidney disease appears stable and there is no evidence of endorgan dysfunction that is new, no evidence of ACS.  EKG was abnormal but last was from March 2018.  Abnormalities likely reflect repolarization or chronic changes.  Blood pressures been quite stable since  admission and he is now stable for discharge.  Hypertensive urgency with dizziness, n/v. EKG SB, anteroseptal infarct, old, inferolateral t-wave inversion. CT head negative. Echo 11/2016 with LVEF 78-24%, normal diastolic function.  --SBP down to 160-80s --troponins negative  --Repeat EKG as below  Abnormal EKG --EKGs independently reviewed, most recently 1315.  Independent review shows sinus rhythm, anterior infarct, old, inferolateral ST depression, suspect repolarization abnormality. --Given the above, there is no reason to suspect acute EKG changes or ACS.  No further inpatient evaluation is suggested. --Follow-up as an outpatient  Essential HTN --Stable.  Continue metoprolol, amlodipine  DM type 2 --stable CBG. AG WNL.  CKD stage III --stable, f/u as an outpatient  Hyperlipidemia --continue atorvastatin   Today's assessment: S: Feels very well, no chest pain or shortness of breath.  Ambulating without difficulty, no dizziness or lightheadedness.  Reports that he missed a dose of metoprolol yesterday and wonders if this is related to elevated blood pressure. O: Vitals:  Vitals:   08/16/18 1119 08/16/18 1241  BP: 90/73 (!) 153/73  Pulse: 68 68  Resp: 20 19  Temp: 98 F (36.7 C)   SpO2: 98% 99%    Constitutional:  . Appears calm and comfortable Eyes:  . pupils and irises appear normal ENMT:  . grossly normal hearing  Respiratory:  . CTA bilaterally, no w/r/r.  . Respiratory effort normal.  Cardiovascular:  . RRR, no m/r/g . No LE extremity edema   Abdomen:  . Soft Psychiatric:  . judgement and insight appear normal . Mental status o Mood, affect appropriate  I have personally reviewed the following:   Data:  troponins negative  Creatinine stable 2.5  CBC unremarkable  CXR NAD independent review  Discharge Instructions  Discharge Instructions    Activity as tolerated - No restrictions   Complete by:  As directed    Diet - low sodium heart  healthy   Complete by:  As directed    Discharge instructions   Complete by:  As directed    Call your physician or seek immediate medical attention for dizziness, lightheadedness, chest pain, nausea, vomiting, swelling or worsening of condition.     Allergies as of 08/16/2018      Reactions   Naproxen Sodium Swelling   Face became swollen   Yellow Jacket Venom [bee Venom] Anaphylaxis, Hives, Rash      Medication List    TAKE these medications   amLODipine 5 MG tablet Commonly known as:  NORVASC Take 1 tablet (5 mg total) by mouth daily. What changed:  when to take this   aspirin 81 MG chewable tablet Chew 81 mg by mouth 3 (three) times a week.   atorvastatin 10 MG tablet Commonly known as:  LIPITOR Take 10 mg by mouth at bedtime.   clonazePAM 1 MG tablet Commonly known as:  KLONOPIN Take 1 mg by mouth 2 (two) times daily as needed for anxiety.   fish oil-omega-3 fatty acids 1000 MG capsule Take 1 g by mouth 2 (two) times daily.   glipiZIDE 5 MG tablet Commonly known as:  GLUCOTROL Take 5 mg by mouth daily as needed (30 minutes prior to a large meal being consumed).   loratadine 10 MG tablet Commonly known as:  CLARITIN Take 10 mg by mouth daily.   metoprolol tartrate 100 MG tablet Commonly known as:  LOPRESSOR Take 100 mg by mouth 2 (two) times daily.   ONE-A-DAY MENS 50+ ADVANTAGE Tabs Take 1 tablet by mouth daily.   PARoxetine 20 MG tablet Commonly known as:  PAXIL Take 20 mg by mouth every morning.   traMADol 50 MG tablet Commonly known as:  ULTRAM Take 50-100 mg by mouth See admin instructions. Take 50-100 mg by mouth every other day as needed for pain      Allergies  Allergen Reactions  . Naproxen Sodium Swelling    Face became swollen  . Yellow Jacket Venom [Bee Venom] Anaphylaxis, Hives and Rash    The results of significant diagnostics from this hospitalization (including imaging, microbiology, ancillary and laboratory) are listed below for  reference.    Significant Diagnostic Studies: Dg Chest 2 View  Result Date: 08/15/2018 CLINICAL DATA:  Hypertension. Dizziness for 1 day. Smoker. EXAM: CHEST - 2 VIEW COMPARISON:  11/26/2016. FINDINGS: Stable enlarged cardiac silhouette. Clear lungs with normal vascularity. Minimal thoracic spine degenerative changes. IMPRESSION: No acute abnormality.  Stable cardiomegaly. Electronically Signed   By: Claudie Revering M.D.   On: 08/15/2018 23:47   Ct Head Wo Contrast  Result Date: 08/15/2018 CLINICAL DATA:  67 y/o M; new onset dizziness, nausea, vomiting. Hypertension. EXAM: CT HEAD WITHOUT CONTRAST TECHNIQUE: Contiguous axial images were obtained from the base of the skull through the vertex without intravenous contrast. COMPARISON:  None. FINDINGS: Brain: No evidence of acute infarction, hemorrhage, hydrocephalus, extra-axial collection or mass lesion/mass effect. 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 Nonspecific white matter hypodensities are compatible with moderate chronic microvascular ischemic changes for age. Moderate volume loss of the brain. Vascular: Calcific atherosclerosis of carotid siphons. No hyperdense vessel identified. Skull: Normal. Negative for fracture or focal lesion. Sinuses/Orbits: Mild paranasal sinus mucosal thickening. Right maxillary  sinus fluid level. Chronic inflammatory changes of the walls of the right maxillary sinus. Normal aeration of bilateral mastoid air cells. Orbits are unremarkable. Other: None. IMPRESSION: 1. No acute intracranial abnormality identified. 2. Moderate chronic microvascular ischemic changes and volume loss of the brain for age. 3. Paranasal sinus disease with a small right maxillary sinus fluid level which may represent acute sinusitis in the appropriate clinical setting. Electronically Signed   By: Kristine Garbe M.D.   On: 08/15/2018 22:17   Labs: Basic Metabolic Panel: Recent Labs  Lab 08/15/18 2216 08/16/18 0540   NA 137 138  K 4.0 3.9  CL 102 103  CO2 21* 22  GLUCOSE 137* 124*  BUN 36* 34*  CREATININE 2.42* 2.50*  CALCIUM 8.5* 8.7*   Liver Function Tests: Recent Labs  Lab 08/15/18 2216  AST 16  ALT 14  ALKPHOS 52  BILITOT 0.7  PROT 6.9  ALBUMIN 3.4*   CBC: Recent Labs  Lab 08/15/18 2216  WBC 12.6*  NEUTROABS 10.3*  HGB 13.1  HCT 42.8  MCV 91.3  PLT 171   Cardiac Enzymes: Recent Labs  Lab 08/15/18 2216 08/16/18 0120 08/16/18 0540 08/16/18 1148  TROPONINI <0.03 <0.03 <0.03 <0.03    CBG: Recent Labs  Lab 08/16/18 0604  GLUCAP 127*    Principal Problem:   Hypertensive urgency Active Problems:   CKD (chronic kidney disease) stage 3, GFR 30-59 ml/min ([facility])   Time coordinating discharge: 35 minutes  Signed:  Murray Hodgkins, MD [facility] 08/16/2018, 2:30 PM

## 2018-08-16 NOTE — ED Notes (Signed)
bfast tray ordered 

## 2019-05-18 DIAGNOSIS — R809 Proteinuria, unspecified: Secondary | ICD-10-CM | POA: Insufficient documentation

## 2019-05-18 DIAGNOSIS — R0989 Other specified symptoms and signs involving the circulatory and respiratory systems: Secondary | ICD-10-CM

## 2019-05-18 HISTORY — DX: Other specified symptoms and signs involving the circulatory and respiratory systems: R09.89

## 2019-07-19 DIAGNOSIS — E559 Vitamin D deficiency, unspecified: Secondary | ICD-10-CM

## 2019-07-19 DIAGNOSIS — N2581 Secondary hyperparathyroidism of renal origin: Secondary | ICD-10-CM

## 2019-07-19 HISTORY — DX: Vitamin D deficiency, unspecified: E55.9

## 2019-07-19 HISTORY — DX: Secondary hyperparathyroidism of renal origin: N25.81

## 2019-11-06 ENCOUNTER — Ambulatory Visit: Payer: Medicare Other | Attending: Internal Medicine

## 2019-11-06 DIAGNOSIS — Z23 Encounter for immunization: Secondary | ICD-10-CM | POA: Insufficient documentation

## 2019-11-06 NOTE — Progress Notes (Signed)
   Covid-19 Vaccination Clinic  Name:  Jagur Aksamit    MRN: [id number] DOB: [birth year]  11/06/2019  Mr. Thomsen was observed post Covid-19 immunization for 15 minutes without incidence. He was provided with Vaccine Information Sheet and instruction to access the V-Safe system.   Mr. Maughan was instructed to call 911 with any severe reactions post vaccine: Marland Kitchen Difficulty breathing  . Swelling of your face and throat  . A fast heartbeat  . A bad rash all over your body  . Dizziness and weakness    Immunizations Administered    Name Date Dose VIS Date Route   Pfizer COVID-19 Vaccine 11/06/2019 10:27 AM 0.3 mL 08/18/2019 Intramuscular   Manufacturer: [place]   Lot: [id number]   [place]: [id number]

## 2019-11-08 HISTORY — DX: Hypocalcemia: E83.51

## 2019-11-15 IMAGING — CR DG CHEST 2V
2 series · 2 of 2 positions shown · non-contrast
Comparison: 11/26/2016.

CLINICAL DATA: Hypertension. Dizziness for 1 day. Smoker.

EXAM:
CHEST - 2 VIEW

[chest lat]
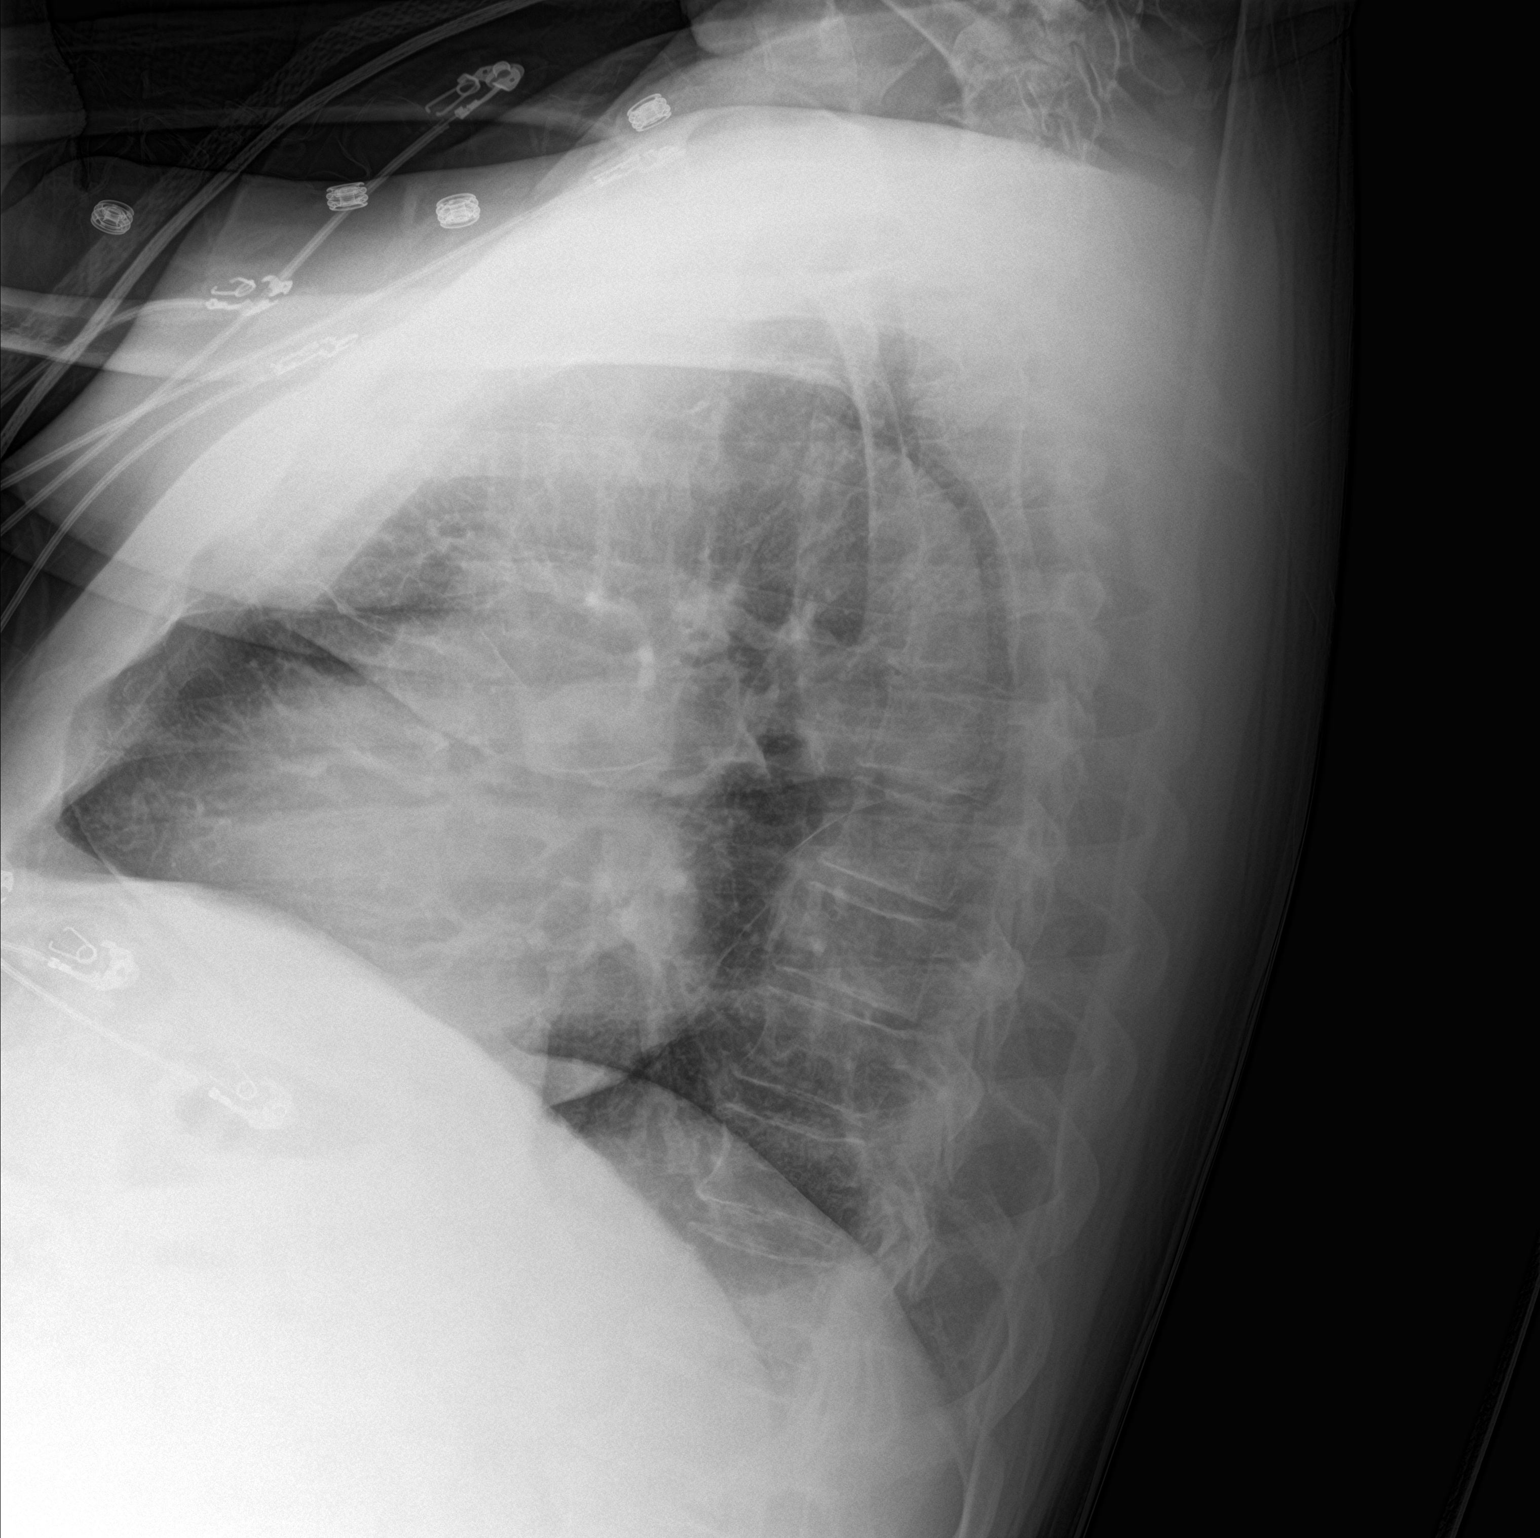

[chest ap]
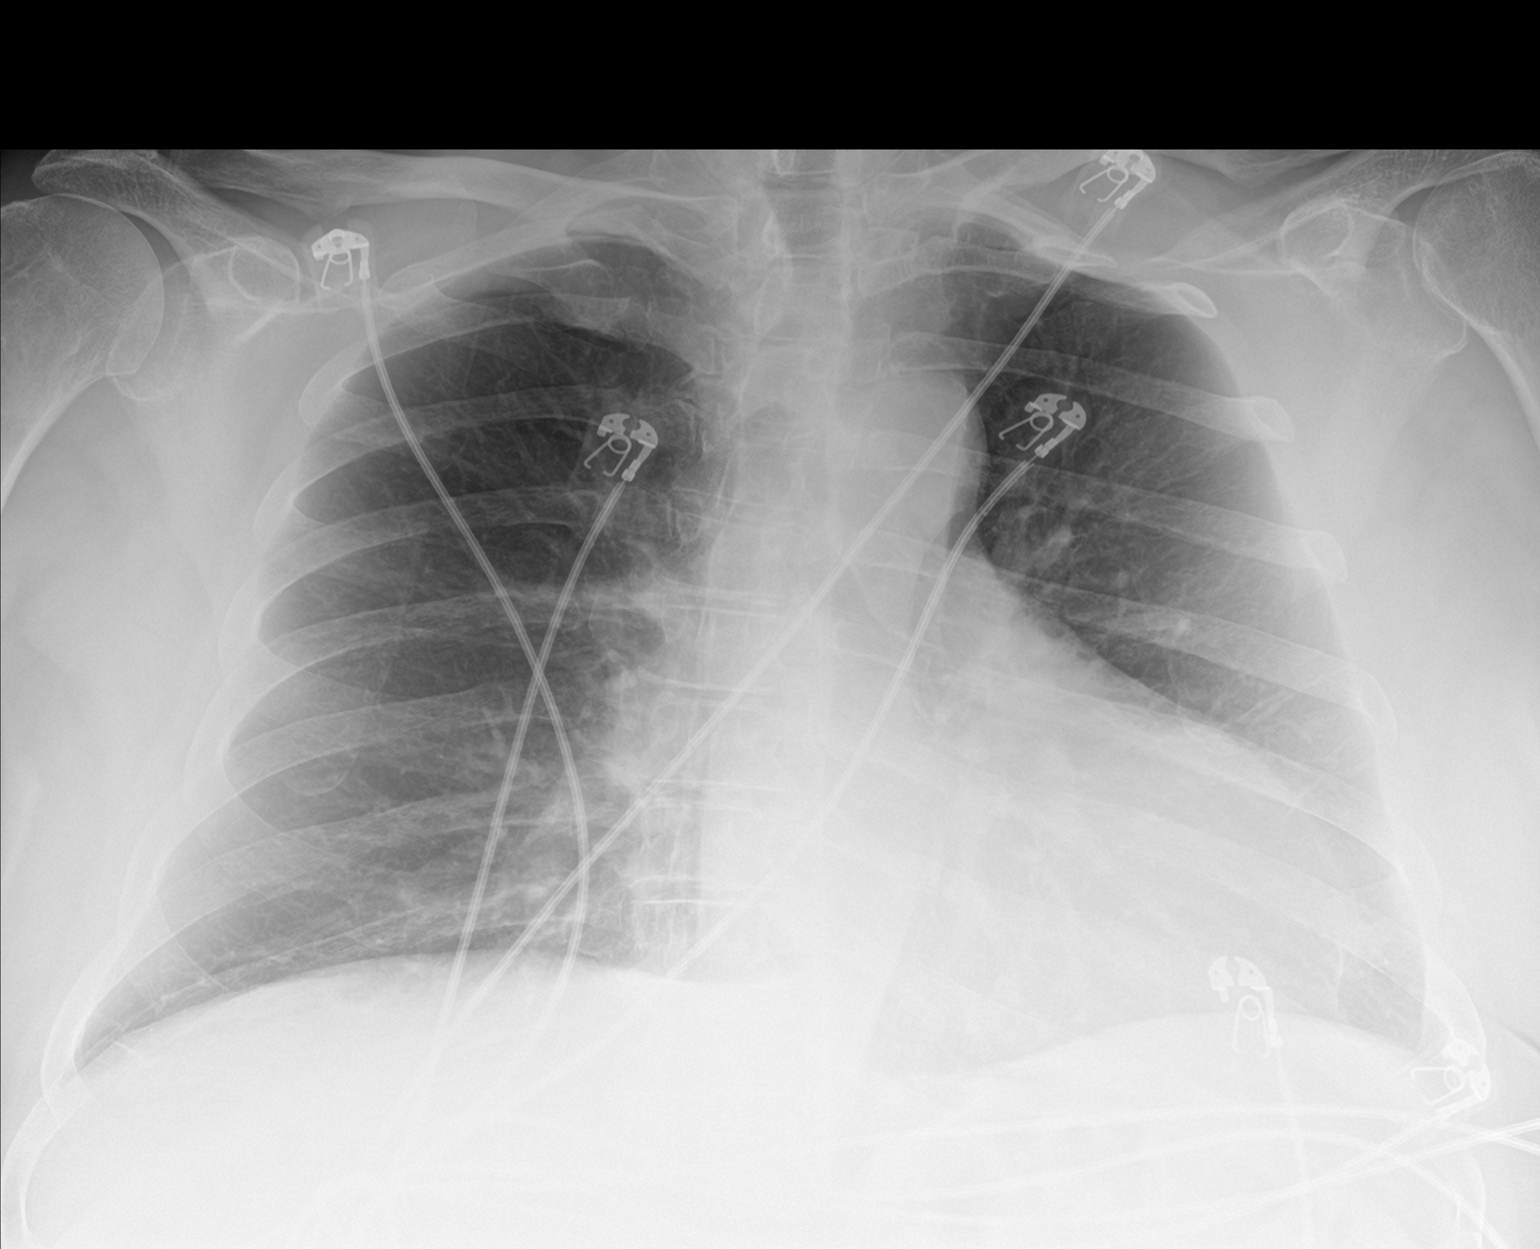

[2 of 2 positions shown; findings below may reference images not displayed]

FINDINGS: Stable enlarged cardiac silhouette. Clear lungs with normal
vascularity. Minimal thoracic spine degenerative changes.
IMPRESSION: No acute abnormality.  Stable cardiomegaly.

## 2019-11-29 ENCOUNTER — Ambulatory Visit: Payer: Medicare Other | Attending: Internal Medicine

## 2019-11-29 DIAGNOSIS — Z23 Encounter for immunization: Secondary | ICD-10-CM

## 2019-11-29 NOTE — Progress Notes (Signed)
   Covid-19 Vaccination Clinic  Name:  Gilbert Holmes    MRN: [id number] DOB: 07-Mar-1951  11/29/2019  Gilbert Holmes was observed post Covid-19 immunization for 15 minutes without incident. He was provided with Vaccine Information Sheet and instruction to access the V-Safe system.   Gilbert Holmes was instructed to call 911 with any severe reactions post vaccine: Marland Kitchen Difficulty breathing  . Swelling of face and throat  . A fast heartbeat  . A bad rash all over body  . Dizziness and weakness   Immunizations Administered    Name Date Dose VIS Date Route   Pfizer COVID-19 Vaccine 11/29/2019 10:24 AM 0.3 mL 08/18/2019 Intramuscular   Manufacturer: [place]   Lot: [id number]   [place]: [id number]

## 2020-04-02 ENCOUNTER — Encounter (HOSPITAL_COMMUNITY): Payer: Self-pay | Admitting: Emergency Medicine

## 2020-04-02 ENCOUNTER — Observation Stay (HOSPITAL_COMMUNITY)
Admission: EM | Admit: 2020-04-02 | Discharge: 2020-04-03 | Disposition: A | Discharge status: Home / Self Care | Payer: Medicare Other | Attending: Internal Medicine | Admitting: Internal Medicine

## 2020-04-02 ENCOUNTER — Emergency Department (HOSPITAL_COMMUNITY): Payer: Medicare Other

## 2020-04-02 DIAGNOSIS — R0602 Shortness of breath: Principal | ICD-10-CM | POA: Insufficient documentation

## 2020-04-02 DIAGNOSIS — E785 Hyperlipidemia, unspecified: Secondary | ICD-10-CM | POA: Insufficient documentation

## 2020-04-02 DIAGNOSIS — Z20822 Contact with and (suspected) exposure to covid-19: Secondary | ICD-10-CM | POA: Insufficient documentation

## 2020-04-02 DIAGNOSIS — D631 Anemia in chronic kidney disease: Secondary | ICD-10-CM | POA: Diagnosis not present

## 2020-04-02 DIAGNOSIS — I129 Hypertensive chronic kidney disease with stage 1 through stage 4 chronic kidney disease, or unspecified chronic kidney disease: Secondary | ICD-10-CM | POA: Insufficient documentation

## 2020-04-02 DIAGNOSIS — F419 Anxiety disorder, unspecified: Secondary | ICD-10-CM | POA: Diagnosis not present

## 2020-04-02 DIAGNOSIS — J9621 Acute and chronic respiratory failure with hypoxia: Secondary | ICD-10-CM | POA: Insufficient documentation

## 2020-04-02 DIAGNOSIS — N184 Chronic kidney disease, stage 4 (severe): Secondary | ICD-10-CM | POA: Insufficient documentation

## 2020-04-02 DIAGNOSIS — J9601 Acute respiratory failure with hypoxia: Secondary | ICD-10-CM | POA: Diagnosis present

## 2020-04-02 DIAGNOSIS — E1122 Type 2 diabetes mellitus with diabetic chronic kidney disease: Secondary | ICD-10-CM | POA: Insufficient documentation

## 2020-04-02 DIAGNOSIS — I10 Essential (primary) hypertension: Secondary | ICD-10-CM | POA: Diagnosis present

## 2020-04-02 DIAGNOSIS — N179 Acute kidney failure, unspecified: Secondary | ICD-10-CM | POA: Insufficient documentation

## 2020-04-02 DIAGNOSIS — J9600 Acute respiratory failure, unspecified whether with hypoxia or hypercapnia: Secondary | ICD-10-CM | POA: Diagnosis present

## 2020-04-02 DIAGNOSIS — R05 Cough: Secondary | ICD-10-CM | POA: Insufficient documentation

## 2020-04-02 DIAGNOSIS — N189 Chronic kidney disease, unspecified: Secondary | ICD-10-CM | POA: Diagnosis present

## 2020-04-02 DIAGNOSIS — R0902 Hypoxemia: Secondary | ICD-10-CM

## 2020-04-02 DIAGNOSIS — F17210 Nicotine dependence, cigarettes, uncomplicated: Secondary | ICD-10-CM | POA: Insufficient documentation

## 2020-04-02 HISTORY — DX: Acute respiratory failure, unspecified whether with hypoxia or hypercapnia: J96.00

## 2020-04-02 LAB — BASIC METABOLIC PANEL
Anion gap: 8 (ref 5–15)
BUN: 46 mg/dL — ABNORMAL HIGH (ref 8–23)
CO2: 21 mmol/L — ABNORMAL LOW (ref 22–32)
Calcium: 8.3 mg/dL — ABNORMAL LOW (ref 8.9–10.3)
Chloride: 103 mmol/L (ref 98–111)
Creatinine, Ser: 3.26 mg/dL — ABNORMAL HIGH (ref 0.61–1.24)
GFR calc Af Amer: 21 mL/min — ABNORMAL LOW (ref >60)
GFR calc non Af Amer: 18 mL/min — ABNORMAL LOW (ref >60)
Glucose, Bld: 117 mg/dL — ABNORMAL HIGH (ref 70–99)
Potassium: 4.3 mmol/L (ref 3.5–5.1)
Sodium: 132 mmol/L — ABNORMAL LOW (ref 135–145)

## 2020-04-02 LAB — CBC
HCT: 34.2 % — ABNORMAL LOW (ref 39.0–52.0)
Hemoglobin: 10.9 g/dL — ABNORMAL LOW (ref 13.0–17.0)
MCH: 29.5 pg (ref 26.0–34.0)
MCHC: 31.9 g/dL (ref 30.0–36.0)
MCV: 92.4 fL (ref 80.0–100.0)
Platelets: 132 10*3/uL — ABNORMAL LOW (ref 150–400)
RBC: 3.7 MIL/uL — ABNORMAL LOW (ref 4.22–5.81)
RDW: 15.2 % (ref 11.5–15.5)
WBC: 7.8 10*3/uL (ref 4.0–10.5)
nRBC: 0 % (ref 0.0–0.2)

## 2020-04-02 LAB — TROPONIN I (HIGH SENSITIVITY)
Troponin I (High Sensitivity): 76 ng/L — ABNORMAL HIGH (ref <18)
Troponin I (High Sensitivity): 72 ng/L — ABNORMAL HIGH (ref <18)

## 2020-04-02 LAB — SARS CORONAVIRUS 2 BY RT PCR (HOSPITAL ORDER, PERFORMED IN ~~LOC~~ HOSPITAL LAB): SARS Coronavirus 2: NEGATIVE

## 2020-04-02 MED ORDER — SODIUM CHLORIDE 0.9 % IV SOLN
500.0000 mg | Freq: Once | INTRAVENOUS | Status: AC
  Administered 2020-04-02: 500 mg via INTRAVENOUS
  Filled 2020-04-02: qty 500

## 2020-04-02 MED ORDER — ALBUTEROL SULFATE HFA 108 (90 BASE) MCG/ACT IN AERS
2.0000 | INHALATION_SPRAY | Freq: Once | RESPIRATORY_TRACT | Status: AC
  Administered 2020-04-02: 2 via RESPIRATORY_TRACT
  Filled 2020-04-02: qty 6.7

## 2020-04-02 MED ORDER — SODIUM CHLORIDE 0.9 % IV SOLN
1.0000 g | Freq: Once | INTRAVENOUS | Status: AC
  Administered 2020-04-02: 1 g via INTRAVENOUS
  Filled 2020-04-02: qty 10

## 2020-04-02 MED ORDER — METHYLPREDNISOLONE SODIUM SUCC 125 MG IJ SOLR
125.0000 mg | Freq: Once | INTRAMUSCULAR | Status: AC
  Administered 2020-04-02: 125 mg via INTRAVENOUS
  Filled 2020-04-02: qty 2

## 2020-04-02 MED ORDER — SODIUM CHLORIDE 0.9% FLUSH
3.0000 mL | Freq: Once | INTRAVENOUS | Status: AC
  Administered 2020-04-02: 3 mL via INTRAVENOUS

## 2020-04-02 NOTE — ED Provider Notes (Signed)
[facility] EMERGENCY DEPARTMENT Provider Note   CSN: [id number] Arrival date & time: 04/02/20  1334     History Chief Complaint  Patient presents with  . Cough  . Pneumonia    Kristjan Derner is a 69 y.o. male.  Patient with a complaint of shortness of breath for 2 days.  Patient went to [facility] urgent [facility] had a chest x-ray done there stated he had pneumonia.  Also had low oxygen sats.  Patient is a smoker but no formal diagnosis of COPD.  Does have a history of chronic kidney disease.  Patient not on home oxygen.  Patient's had both vaccines.        Past Medical History:  Diagnosis Date  . Arthritis   . Cellulitis and abscess of lower extremity 11/2016  . CKD (chronic kidney disease) stage 3, GFR 30-59 ml/min   . Diabetes mellitus   . DOE (dyspnea on exertion) 11/2016  . Hyperlipidemia   . Hypertension     Patient Active Problem List   Diagnosis Date Noted  . Acute respiratory failure ([place]) 04/02/2020  . Hypertensive urgency 08/16/2018  . CKD (chronic kidney disease) stage 3, GFR 30-59 ml/min 08/16/2018  . Acute diastolic CHF (congestive heart failure) ([place]) 11/28/2016  . Acute respiratory failure with hypoxia ([place]) 11/28/2016  . Streptococcal sepsis with acute renal failure ([place]) 11/28/2016  . Bilateral lower leg cellulitis 11/28/2016  . Acute renal failure superimposed on stage 3 chronic kidney disease ([place]) 11/28/2016  . Anemia of chronic disease 11/28/2016  . Depression 11/28/2016  . Tobacco abuse 11/28/2016  . Dyslipidemia 11/28/2016  . HTN (hypertension) 11/25/2016  . Elevated troponin     Past Surgical History:  Procedure Laterality Date  . ANKLE FRACTURE SURGERY         Family History  Problem Relation Age of Onset  . CAD Brother        CABG in his 92's.   . Cancer Mother   . Heart disease Father   . Hypertension Father     Social History   Tobacco Use  . Smoking status: Current Every Day Smoker    Packs/day: 1.50     Years: 30.00    Pack years: 45.00    Types: Cigarettes  . Smokeless tobacco: Never Used  Vaping Use  . Vaping Use: Never used  Substance Use Topics  . Alcohol use: No  . Drug use: No    Home Medications Prior to Admission medications   Medication Sig Start Date End Date Taking? Authorizing Provider  amLODipine (NORVASC) 10 MG tablet Take 10 mg by mouth daily. 01/08/20  Yes [provider]  ascorbic acid (VITAMIN C) 500 MG tablet Take 500 mg by mouth every other day.   Yes [provider]  atorvastatin (LIPITOR) 20 MG tablet Take 20 mg by mouth daily. 02/23/20  Yes [provider]  Cholecalciferol 25 MCG (1000 UT) tablet Take 1,000 Units by mouth daily.   Yes [provider]  clonazePAM (KLONOPIN) 1 MG tablet Take 1 mg by mouth 2 (two) times daily as needed for anxiety.   Yes [provider]  ferrous sulfate 325 (65 FE) MG tablet Take 325 mg by mouth every other day.   Yes [provider]  Flaxseed, Linseed, (FLAX SEED OIL) 1300 MG CAPS Take 1,300 mg by mouth daily.   Yes [provider]  hydrALAZINE (APRESOLINE) 50 MG tablet Take 50 mg by mouth in the morning and at bedtime. 01/08/20  Yes [provider]  loratadine (CLARITIN) 10 MG tablet Take 10 mg by mouth daily.   Yes [provider]  metoprolol tartrate (LOPRESSOR) 100 MG tablet Take 100 mg by mouth 2 (two) times daily. 05/27/18  Yes [provider]  Multiple Vitamins-Minerals (ONE-A-DAY MENS 50+ ADVANTAGE) TABS Take 1 tablet by mouth daily.   Yes [provider]  PARoxetine (PAXIL) 20 MG tablet Take 20 mg by mouth daily.    Yes [provider]  amLODipine (NORVASC) 5 MG tablet Take 1 tablet (5 mg total) by mouth daily. Patient not taking: Reported on 04/02/2020 11/30/16   Mendel Corning, MD    Allergies    Naproxen sodium and Yellow jacket venom [bee venom]  Review of Systems   Review of Systems  Constitutional: Negative for  chills and fever.  HENT: Negative for congestion, rhinorrhea and sore throat.   Eyes: Negative for visual disturbance.  Respiratory: Positive for shortness of breath. Negative for cough.   Cardiovascular: Negative for chest pain and leg swelling.  Gastrointestinal: Negative for abdominal pain, diarrhea, nausea and vomiting.  Genitourinary: Negative for dysuria.  Musculoskeletal: Negative for back pain and neck pain.  Skin: Negative for rash.  Neurological: Negative for dizziness, light-headedness and headaches.  Hematological: Does not bruise/bleed easily.  Psychiatric/Behavioral: Negative for confusion.    Physical Exam Updated Vital Signs BP (!) 107/34   Pulse 82   Temp 98.7 F (37.1 C) (Oral)   Resp 18   Ht 1.727 m (5\' 8" )   Wt (!) 92.5 kg   SpO2 91%   BMI 31.02 kg/m   Physical Exam Vitals and nursing note reviewed.  Constitutional:      Appearance: Normal appearance. He is well-developed.  HENT:     Head: Normocephalic and atraumatic.  Eyes:     Conjunctiva/sclera: Conjunctivae normal.     Pupils: Pupils are equal, round, and reactive to light.  Cardiovascular:     Rate and Rhythm: Normal rate and regular rhythm.     Heart sounds: No murmur heard.   Pulmonary:     Effort: Pulmonary effort is normal. No respiratory distress.     Breath sounds: Wheezing present.  Abdominal:     Palpations: Abdomen is soft.     Tenderness: There is no abdominal tenderness.  Musculoskeletal:        General: No swelling. Normal range of motion.     Cervical back: Normal range of motion and neck supple.  Skin:    General: Skin is warm and dry.  Neurological:     General: No focal deficit present.     Mental Status: He is alert and oriented to person, place, and time.     Cranial Nerves: No cranial nerve deficit.     Sensory: No sensory deficit.     Motor: No weakness.     ED Results / Procedures / Treatments   Labs (all labs ordered are listed, but only abnormal results are  displayed) Labs Reviewed  BASIC METABOLIC PANEL - Abnormal; Notable for the following components:      Result Value   Sodium 132 (*)    CO2 21 (*)    Glucose, Bld 117 (*)    BUN 46 (*)    Creatinine, Ser 3.26 (*)    Calcium 8.3 (*)    GFR calc non Af Amer 18 (*)    GFR calc Af Amer 21 (*)    All other components within normal limits  CBC -  Abnormal; Notable for the following components:   RBC 3.70 (*)    Hemoglobin 10.9 (*)    HCT 34.2 (*)    Platelets 132 (*)    All other components within normal limits  TROPONIN I (HIGH SENSITIVITY) - Abnormal; Notable for the following components:   Troponin I (High Sensitivity) 76 (*)    All other components within normal limits  TROPONIN I (HIGH SENSITIVITY) - Abnormal; Notable for the following components:   Troponin I (High Sensitivity) 72 (*)    All other components within normal limits  SARS CORONAVIRUS 2 BY RT PCR (HOSPITAL ORDER, [facility])  TROPONIN I (HIGH SENSITIVITY)    EKG EKG Interpretation  Date/Time:  Tuesday April 02 2020 13:47:27 EDT Ventricular Rate:  56 PR Interval:  164 QRS Duration: 80 QT Interval:  444 QTC Calculation: 428 R Axis:   104 Text Interpretation: Sinus bradycardia Rightward axis Anteroseptal infarct , age undetermined Abnormal ECG Confirmed by Fredia Sorrow [phone]) on 04/02/2020 7:02:55 PM   Radiology DG Chest Portable 1 View  Result Date: 04/02/2020 CLINICAL DATA:  69 year old male with shortness of breath. EXAM: PORTABLE CHEST 1 VIEW COMPARISON:  Chest radiograph dated 04/02/2020. FINDINGS: Faint bibasilar densities, likely atelectasis. A small left pleural effusion is not excluded. No focal consolidation or pneumothorax. Mild cardiomegaly. Atherosclerotic calcification of the aorta. No acute osseous pathology. IMPRESSION: No focal consolidation. Electronically Signed   By: Anner Crete M.D.   On: 04/02/2020 19:18    Procedures Procedures (including critical care  time)  Medications Ordered in ED Medications  sodium chloride flush (NS) 0.9 % injection 3 mL (3 mLs Intravenous Given 04/02/20 1904)  albuterol (VENTOLIN HFA) 108 (90 Base) MCG/ACT inhaler 2 puff (2 puffs Inhalation Given 04/02/20 1904)  cefTRIAXone (ROCEPHIN) 1 g in sodium chloride 0.9 % 100 mL IVPB (0 g Intravenous Stopped 04/02/20 1935)  methylPREDNISolone sodium succinate (SOLU-MEDROL) 125 mg/2 mL injection 125 mg (125 mg Intravenous Given 04/02/20 1904)  azithromycin (ZITHROMAX) 500 mg in sodium chloride 0.9 % 250 mL IVPB (0 mg Intravenous Stopped 04/02/20 2203)    ED Course  I have reviewed the triage vital signs and the nursing notes.  Pertinent labs & imaging results that were available during my care of the patient were reviewed by me and considered in my medical decision making (see chart for details).    MDM Rules/Calculators/A&P                          Patient with shortness of breath.  Had bilateral wheezing.  Treated with Solu-Medrol as well as albuterol.  Which resolved the wheezing.  Patient also started on IV antibiotics for the presumed pneumonia based on the chest x-ray from [facility].  The patient received IV Rocephin and IV Zithromax.  Chest x-ray here however did not show evidence of pneumonia.  Think this may be an exacerbation of COPD.  But he does have an oxygen requirement even with the wheezing resolved removing the oxygen his sats go down below 90%.  Patient in no distress.  Covid negative.  Overall patient improved here.  Discussed with hospitalist who will admit.  Also had discussion about pulmonary embolus not being completely ruled out.  But due to his kidney function which is a bit worse than usual.  So he does have an element of acute kidney injury is not a candidate for CTA.  Would have to have VQ scan.  I still feel that clinically this is all most likely all COPD exacerbation with a new requirement for home oxygen.   Final Clinical Impression(s)  / ED Diagnoses Final diagnoses:  SOB (shortness of breath)  Hypoxia  AKI (acute kidney injury) [facility])    Rx / DC Orders ED Discharge Orders    None       Fredia Sorrow, MD 04/02/20 2347

## 2020-04-02 NOTE — ED Triage Notes (Signed)
Pt arrives to ED with c/o of cough and congestion. Pt denies any trouble breathing or CP. Pt states he just left a [facility] office and had a chest xray and diagnosed with pneumonia. Pt states he was told to come to ER for a breathing treatment.Gilbert Holmes

## 2020-04-03 ENCOUNTER — Observation Stay (HOSPITAL_BASED_OUTPATIENT_CLINIC_OR_DEPARTMENT_OTHER): Payer: Medicare Other

## 2020-04-03 ENCOUNTER — Other Ambulatory Visit: Payer: Self-pay

## 2020-04-03 ENCOUNTER — Encounter (HOSPITAL_COMMUNITY): Payer: Self-pay | Admitting: Internal Medicine

## 2020-04-03 DIAGNOSIS — D631 Anemia in chronic kidney disease: Secondary | ICD-10-CM

## 2020-04-03 DIAGNOSIS — J9601 Acute respiratory failure with hypoxia: Secondary | ICD-10-CM | POA: Diagnosis not present

## 2020-04-03 DIAGNOSIS — I10 Essential (primary) hypertension: Secondary | ICD-10-CM | POA: Diagnosis not present

## 2020-04-03 DIAGNOSIS — I361 Nonrheumatic tricuspid (valve) insufficiency: Secondary | ICD-10-CM

## 2020-04-03 DIAGNOSIS — I340 Nonrheumatic mitral (valve) insufficiency: Secondary | ICD-10-CM

## 2020-04-03 DIAGNOSIS — N184 Chronic kidney disease, stage 4 (severe): Secondary | ICD-10-CM

## 2020-04-03 DIAGNOSIS — I351 Nonrheumatic aortic (valve) insufficiency: Secondary | ICD-10-CM

## 2020-04-03 DIAGNOSIS — N189 Chronic kidney disease, unspecified: Secondary | ICD-10-CM | POA: Diagnosis present

## 2020-04-03 HISTORY — DX: Chronic kidney disease, unspecified: N18.9

## 2020-04-03 HISTORY — DX: Anemia in chronic kidney disease: D63.1

## 2020-04-03 HISTORY — DX: Chronic kidney disease, stage 4 (severe): N18.4

## 2020-04-03 LAB — ECHOCARDIOGRAM COMPLETE
AR max vel: 3.31 cm2
AV Peak grad: 10.6 mmHg
Ao pk vel: 1.63 m/s
Area-P 1/2: 2 cm2
Height: 68 in
P 1/2 time: 319 msec
S' Lateral: 3.1 cm
Weight: 3153.46 oz

## 2020-04-03 LAB — BASIC METABOLIC PANEL
Anion gap: 12 (ref 5–15)
BUN: 49 mg/dL — ABNORMAL HIGH (ref 8–23)
CO2: 20 mmol/L — ABNORMAL LOW (ref 22–32)
Calcium: 8.5 mg/dL — ABNORMAL LOW (ref 8.9–10.3)
Chloride: 104 mmol/L (ref 98–111)
Creatinine, Ser: 3.45 mg/dL — ABNORMAL HIGH (ref 0.61–1.24)
GFR calc Af Amer: 20 mL/min — ABNORMAL LOW (ref >60)
GFR calc non Af Amer: 17 mL/min — ABNORMAL LOW (ref >60)
Glucose, Bld: 166 mg/dL — ABNORMAL HIGH (ref 70–99)
Potassium: 4.7 mmol/L (ref 3.5–5.1)
Sodium: 136 mmol/L (ref 135–145)

## 2020-04-03 LAB — EXPECTORATED SPUTUM ASSESSMENT W GRAM STAIN, RFLX TO RESP C

## 2020-04-03 LAB — HIV ANTIBODY (ROUTINE TESTING W REFLEX): HIV Screen 4th Generation wRfx: NONREACTIVE

## 2020-04-03 LAB — CBC
HCT: 35 % — ABNORMAL LOW (ref 39.0–52.0)
Hemoglobin: 10.9 g/dL — ABNORMAL LOW (ref 13.0–17.0)
MCH: 28.2 pg (ref 26.0–34.0)
MCHC: 31.1 g/dL (ref 30.0–36.0)
MCV: 90.4 fL (ref 80.0–100.0)
Platelets: 138 10*3/uL — ABNORMAL LOW (ref 150–400)
RBC: 3.87 MIL/uL — ABNORMAL LOW (ref 4.22–5.81)
RDW: 14.9 % (ref 11.5–15.5)
WBC: 6.6 10*3/uL (ref 4.0–10.5)
nRBC: 0 % (ref 0.0–0.2)

## 2020-04-03 LAB — TROPONIN I (HIGH SENSITIVITY)
Troponin I (High Sensitivity): 47 ng/L — ABNORMAL HIGH (ref <18)
Troponin I (High Sensitivity): 45 ng/L — ABNORMAL HIGH (ref <18)

## 2020-04-03 LAB — D-DIMER, QUANTITATIVE: D-Dimer, Quant: 1.62 ug/mL-FEU — ABNORMAL HIGH (ref 0.00–0.50)

## 2020-04-03 LAB — BRAIN NATRIURETIC PEPTIDE: B Natriuretic Peptide: 1499.7 pg/mL — ABNORMAL HIGH (ref 0.0–100.0)

## 2020-04-03 MED ORDER — ONDANSETRON HCL 4 MG/2ML IJ SOLN: 4.0000 mg | Freq: Four times a day (QID) | INTRAMUSCULAR | Status: DC | PRN

## 2020-04-03 MED ORDER — FLUTICASONE FUROATE-VILANTEROL 200-25 MCG/INH IN AEPB: 1.0000 | INHALATION_SPRAY | Freq: Every day | RESPIRATORY_TRACT | 0 refills | Status: AC

## 2020-04-03 MED ORDER — SODIUM CHLORIDE 0.9 % IV SOLN
1.0000 g | Freq: Once | INTRAVENOUS | Status: AC
  Administered 2020-04-03: 1 g via INTRAVENOUS
  Filled 2020-04-03: qty 1

## 2020-04-03 MED ORDER — ALBUTEROL SULFATE (2.5 MG/3ML) 0.083% IN NEBU: 2.5000 mg | INHALATION_SOLUTION | RESPIRATORY_TRACT | Status: DC

## 2020-04-03 MED ORDER — SODIUM CHLORIDE 0.9 % IV SOLN
500.0000 mg | INTRAVENOUS | Status: DC
  Filled 2020-04-03: qty 500

## 2020-04-03 MED ORDER — FERROUS SULFATE 325 (65 FE) MG PO TABS
325.0000 mg | ORAL_TABLET | ORAL | Status: DC
  Administered 2020-04-03: 325 mg via ORAL
  Filled 2020-04-03: qty 1

## 2020-04-03 MED ORDER — CLONAZEPAM 0.5 MG PO TABS: 1.0000 mg | ORAL_TABLET | Freq: Two times a day (BID) | ORAL | Status: DC | PRN

## 2020-04-03 MED ORDER — ALBUTEROL SULFATE (2.5 MG/3ML) 0.083% IN NEBU: 2.5000 mg | INHALATION_SOLUTION | RESPIRATORY_TRACT | Status: DC | PRN

## 2020-04-03 MED ORDER — PAROXETINE HCL 20 MG PO TABS
20.0000 mg | ORAL_TABLET | Freq: Every day | ORAL | Status: DC
  Administered 2020-04-03: 20 mg via ORAL
  Filled 2020-04-03: qty 1

## 2020-04-03 MED ORDER — IPRATROPIUM-ALBUTEROL 0.5-2.5 (3) MG/3ML IN SOLN: 3.0000 mL | RESPIRATORY_TRACT | Status: DC | PRN

## 2020-04-03 MED ORDER — BUDESONIDE 0.25 MG/2ML IN SUSP
0.2500 mg | Freq: Two times a day (BID) | RESPIRATORY_TRACT | Status: DC
  Administered 2020-04-03: 0.25 mg via RESPIRATORY_TRACT
  Filled 2020-04-03: qty 2

## 2020-04-03 MED ORDER — ASPIRIN EC 81 MG PO TBEC: 81.0000 mg | DELAYED_RELEASE_TABLET | Freq: Every day | ORAL | Status: DC

## 2020-04-03 MED ORDER — PREDNISONE 10 MG PO TABS: ORAL_TABLET | ORAL | 0 refills | Status: AC

## 2020-04-03 MED ORDER — IPRATROPIUM BROMIDE 0.02 % IN SOLN: 0.5000 mg | RESPIRATORY_TRACT | Status: DC

## 2020-04-03 MED ORDER — HYDRALAZINE HCL 50 MG PO TABS
50.0000 mg | ORAL_TABLET | Freq: Two times a day (BID) | ORAL | Status: DC
  Administered 2020-04-03: 50 mg via ORAL
  Filled 2020-04-03: qty 1

## 2020-04-03 MED ORDER — METOPROLOL TARTRATE 25 MG PO TABS
100.0000 mg | ORAL_TABLET | Freq: Two times a day (BID) | ORAL | Status: DC
  Administered 2020-04-03: 100 mg via ORAL
  Filled 2020-04-03: qty 4

## 2020-04-03 MED ORDER — ATORVASTATIN CALCIUM 10 MG PO TABS
20.0000 mg | ORAL_TABLET | Freq: Every day | ORAL | Status: DC
  Administered 2020-04-03: 20 mg via ORAL
  Filled 2020-04-03: qty 2

## 2020-04-03 MED ORDER — ALBUTEROL SULFATE HFA 108 (90 BASE) MCG/ACT IN AERS: 2.0000 | INHALATION_SPRAY | Freq: Four times a day (QID) | RESPIRATORY_TRACT | 0 refills | Status: AC | PRN

## 2020-04-03 MED ORDER — ONDANSETRON HCL 4 MG PO TABS: 4.0000 mg | ORAL_TABLET | Freq: Four times a day (QID) | ORAL | Status: DC | PRN

## 2020-04-03 MED ORDER — METHYLPREDNISOLONE SODIUM SUCC 40 MG IJ SOLR
40.0000 mg | Freq: Two times a day (BID) | INTRAMUSCULAR | Status: DC
  Administered 2020-04-03: 40 mg via INTRAVENOUS
  Filled 2020-04-03: qty 1

## 2020-04-03 MED ORDER — IPRATROPIUM-ALBUTEROL 0.5-2.5 (3) MG/3ML IN SOLN
3.0000 mL | RESPIRATORY_TRACT | Status: DC
  Administered 2020-04-03: 3 mL via RESPIRATORY_TRACT
  Filled 2020-04-03: qty 3

## 2020-04-03 MED ORDER — HEPARIN SODIUM (PORCINE) 5000 UNIT/ML IJ SOLN
5000.0000 [IU] | Freq: Three times a day (TID) | INTRAMUSCULAR | Status: DC
  Administered 2020-04-03: 5000 [IU] via SUBCUTANEOUS
  Filled 2020-04-03: qty 1

## 2020-04-03 MED ORDER — AMLODIPINE BESYLATE 10 MG PO TABS
10.0000 mg | ORAL_TABLET | Freq: Every day | ORAL | Status: DC
  Administered 2020-04-03: 10 mg via ORAL
  Filled 2020-04-03: qty 1

## 2020-04-03 MED ORDER — AZITHROMYCIN 500 MG PO TABS
500.0000 mg | ORAL_TABLET | Freq: Every day | ORAL | 0 refills | Status: AC
Start: 2020-04-03 — End: 2020-04-06

## 2020-04-03 MED ORDER — SODIUM CHLORIDE 0.9 % IV SOLN
2.0000 g | INTRAVENOUS | Status: DC
  Filled 2020-04-03: qty 20

## 2020-04-03 NOTE — Discharge Instructions (Signed)

## 2020-04-03 NOTE — Progress Notes (Signed)
SATURATION QUALIFICATIONS: (This note is used to comply with regulatory documentation for home oxygen)  Patient Saturations on Room Air at Rest = 95%  Patient Saturations on Room Air while Ambulating = 94%   

## 2020-04-03 NOTE — Discharge Summary (Signed)
Physician Discharge Summary  Gilbert Holmes EEF:[id number] DOB: 04-23-1951 DOA: 04/02/2020  PCP: [name], MD  Admit date: 04/02/2020 Discharge date: 04/03/2020  Admitted From: Home Discharge disposition: Home   Code Status: Full Code  Diet Recommendation: Cardiac diet   Recommendations for Outpatient Follow-Up:   1. Follow-up with PCP as an outpatient  Discharge Diagnosis:   Active Problems:   HTN (hypertension)   Acute respiratory failure with hypoxia (HCC)   Acute respiratory failure (HCC)   CKD (chronic kidney disease), stage IV (HCC)   Anemia associated with chronic renal failure    History of Present Illness / Brief narrative:  Gilbert Holmes is a 69 y.o. male with PMH of HTN, HLD, DM2, CKD 3, arthritis, chronic everyday smoker Patient presented to the ED on 04/02/2020 with complaint of shortness of breath, progressively increasing in 24 hours, stated with cough and whitish sputum. Chest x-ray done at PCPs office showed possible pneumonia and hence sent to ED.   No fever, chills.  In the ED, afebrile, had bilateral wheezing. He needed supplemental oxygen by nasal collar. Labs with sodium 132, potassium 4.3, BUN creatinine 46/3.26, WBC 7.8, hemoglobin 10.9. Troponin 76 >> 72>> 47. EKG shows normal sinus rhythm with poor R wave progression Chest x-ray did not show any focal consolidation.  Hospital Course:  Acute respiratory failure with hypoxia Acute bronchitis  -Presented with worsening shortness of breath, cough, phlegm.   -Reportedly chest x-ray done at PCPs office was suspicious for pneumonia.  Chest x-ray done in the ED did not show any acute process.   -Patient probably has viral bronchitis versus atypical bacterial pneumonia.   -On admission, he was started on IV Rocephin, IV azithromycin, IV Solu-Medrol 40 mg twice daily.   -Sent for blood culture, sputum culture, Legionella antigen and streptococcal antigen in urine.   -On my evaluation this  morning, patient is sitting up on bed.  Not on supplemental oxygen.  Not in respiratory distress. -I did not appreciate any wheezing or crackles on examination. -Patient wants to go home.  I think he does not need further in-hospital management.  We will discharge him on 3 more days of oral azithromycin, tapering course of prednisone and inhalers. -Patient continues to smoke 6 to 7 cigarettes a day.  He does not use any inhalers or nebulizer at home. -Ambulate on the hallway for ambulatory oxygen measurement.  Hypertension  -Home meds include metoprolol, amlodipine and hydralazine. -Continue to monitor blood pressure  Hyperlipidemia - on statins.  Elevated troponin  -Troponin 76 >> 72>> 47. -EKG shows normal sinus rhythm with poor R wave progression. -Elevated BNP to 1500 this morning.  Euvolemic on examination. -2D echocardiogram showed EF of 60 to 65%, no wall motion abnormality.  Chronic kidney disease stage IV  -creatinine on admission was 3.26 which is at the baseline when compared to labs in [place]. -Usually follows with nephrologist in [facility].  Chronic anemia - on iron supplements. Likely from renal disease.  Anxiety - on Klonopin and Paxil.  Tobacco abuse - advised about quitting. Patient states he is cutting on tobacco.  Mobility:  Independent Code Status:  Code Status: Full Code   Wound care:    Subjective:  Patient was seen and examined this morning.  See above. Chart reviewed. Afebrile, hemodynamically stable, breathing on room air. Labs this morning with creatinine up to 3.45, BNP elevated to 1500.  Discharge Exam:   Vitals:   04/02/20 2230 04/03/20 0222 04/03/20 8832 04/03/20  0754  BP: (!) 107/34 (!) 121/60 (!) 159/117   Pulse: 82 63 79   Resp: 18 20 18    Temp:  98.5 F (36.9 C) 98.6 F (37 C)   TempSrc:      SpO2: 91% 94% 94% 92%  Weight:  89.4 kg    Height:  5\' 8"  (1.727 m)      Body mass index is 29.97 kg/m.  General  exam: Appears calm and comfortable.  Not in respiratory distress Skin: No rashes, lesions or ulcers. HEENT: Atraumatic, normocephalic, supple neck, no obvious bleeding Lungs: Clear to auscultation bilaterally CVS: Regular rate and rhythm, no murmur GI/Abd soft, nontender, nondistended, bowel sound present CNS: Alert, awake monitor x3 Psychiatry: Mood appropriate Extremities: No pedal edema, no calf tenderness  Discharge Instructions:  Stop smoking. Encouraged to use inhalers and nebulizers  Follow-up Information    [name], MD Follow up.   Specialty: Family Medicine Contact information: [place] [address] [phone]              Allergies as of 04/03/2020      Reactions   Naproxen Sodium Swelling   Face became swollen   Yellow Jacket Venom [bee Venom] Anaphylaxis, Hives, Rash      Medication List    TAKE these medications   albuterol 108 (90 Base) MCG/ACT inhaler Commonly known as: VENTOLIN HFA Inhale 2 puffs into the lungs every 6 (six) hours as needed for wheezing or shortness of breath.   amLODipine 10 MG tablet Commonly known as: NORVASC Take 10 mg by mouth daily. What changed: Another medication with the same name was removed. Continue taking this medication, and follow the directions you see here.   ascorbic acid 500 MG tablet Commonly known as: VITAMIN C Take 500 mg by mouth every other day.   atorvastatin 20 MG tablet Commonly known as: LIPITOR Take 20 mg by mouth daily.   azithromycin 500 MG tablet Commonly known as: Zithromax Take 1 tablet (500 mg total) by mouth daily for 3 days. Take 1 tablet daily for 3 days.   Cholecalciferol 25 MCG (1000 UT) tablet Take 1,000 Units by mouth daily.   clonazePAM 1 MG tablet Commonly known as: KLONOPIN Take 1 mg by mouth 2 (two) times daily as needed for anxiety.   ferrous sulfate 325 (65 FE) MG tablet Take 325 mg by mouth every other day.   Flax Seed Oil 1300 MG  Caps Take 1,300 mg by mouth daily.   fluticasone furoate-vilanterol 200-25 MCG/INH Aepb Commonly known as: BREO ELLIPTA Inhale 1 puff into the lungs daily.   hydrALAZINE 50 MG tablet Commonly known as: APRESOLINE Take 50 mg by mouth in the morning and at bedtime.   loratadine 10 MG tablet Commonly known as: CLARITIN Take 10 mg by mouth daily.   metoprolol tartrate 100 MG tablet Commonly known as: LOPRESSOR Take 100 mg by mouth 2 (two) times daily.   One-A-Day Mens 50+ Advantage Tabs Take 1 tablet by mouth daily.   PARoxetine 20 MG tablet Commonly known as: PAXIL Take 20 mg by mouth daily.   predniSONE 10 MG tablet Commonly known as: DELTASONE Take 4 tablets (40 mg) daily for 2 days, then, Take 3 tablets (30 mg) daily for 2 days, then, Take 2 tablets (20 mg) daily for 2 days, then, Take 1 tablets (10 mg) daily for 1 days, then stop            Durable Medical Equipment  (  From admission, onward)         Start     Ordered   04/03/20 0939  For home use only DME Nebulizer/meds  Once       Question Answer Comment  Patient needs a nebulizer to treat with the following condition COPD (chronic obstructive pulmonary disease) ([place])   Length of Need Lifetime      04/03/20 0938   04/03/20 0938  For home use only DME Nebulizer machine  Once       Question Answer Comment  Patient needs a nebulizer to treat with the following condition COPD (chronic obstructive pulmonary disease) ([place])   Length of Need Lifetime      04/03/20 2025          Time coordinating discharge: 35 minutes  The results of significant diagnostics from this hospitalization (including imaging, microbiology, ancillary and laboratory) are listed below for reference.    Procedures and Diagnostic Studies:   DG Chest Portable 1 View  Result Date: 04/02/2020 CLINICAL DATA:  69 year old male with shortness of breath. EXAM: PORTABLE CHEST 1 VIEW COMPARISON:  Chest radiograph dated 04/02/2020. FINDINGS:  Faint bibasilar densities, likely atelectasis. A small left pleural effusion is not excluded. No focal consolidation or pneumothorax. Mild cardiomegaly. Atherosclerotic calcification of the aorta. No acute osseous pathology. IMPRESSION: No focal consolidation. Electronically Signed   By: Anner Crete M.D.   On: 04/02/2020 19:18     Labs:   Basic Metabolic Panel: Recent Labs  Lab 04/02/20 1413 04/03/20 0239  NA 132* 136  K 4.3 4.7  CL 103 104  CO2 21* 20*  GLUCOSE 117* 166*  BUN 46* 49*  CREATININE 3.26* 3.45*  CALCIUM 8.3* 8.5*   GFR Estimated Creatinine Clearance: 22 mL/min (A) (by C-G formula based on SCr of 3.45 mg/dL (H)). Liver Function Tests: No results for input(s): AST, ALT, ALKPHOS, BILITOT, PROT, ALBUMIN in the last 168 hours. No results for input(s): LIPASE, AMYLASE in the last 168 hours. No results for input(s): AMMONIA in the last 168 hours. Coagulation profile No results for input(s): INR, PROTIME in the last 168 hours.  CBC: Recent Labs  Lab 04/02/20 1413 04/03/20 0239  WBC 7.8 6.6  HGB 10.9* 10.9*  HCT 34.2* 35.0*  MCV 92.4 90.4  PLT 132* 138*   Cardiac Enzymes: No results for input(s): CKTOTAL, CKMB, CKMBINDEX, TROPONINI in the last 168 hours. BNP: Invalid input(s): POCBNP CBG: No results for input(s): GLUCAP in the last 168 hours. D-Dimer Recent Labs    04/03/20 0555  DDIMER 1.62*   Hgb A1c No results for input(s): HGBA1C in the last 72 hours. Lipid Profile No results for input(s): CHOL, HDL, LDLCALC, TRIG, CHOLHDL, LDLDIRECT in the last 72 hours. Thyroid function studies No results for input(s): TSH, T4TOTAL, T3FREE, THYROIDAB in the last 72 hours.  Invalid input(s): FREET3 Anemia work up No results for input(s): VITAMINB12, FOLATE, FERRITIN, TIBC, IRON, RETICCTPCT in the last 72 hours. Microbiology Recent Results (from the past 240 hour(s))  SARS Coronavirus 2 by RT PCR (hospital order, performed in [facility] hospital lab)  Nasopharyngeal Nasopharyngeal Swab     Status: None   Collection Time: 04/02/20  7:05 PM   Specimen: Nasopharyngeal Swab  Result Value Ref Range Status   SARS Coronavirus 2 NEGATIVE NEGATIVE Final    Comment: (NOTE) SARS-CoV-2 target nucleic acids are NOT DETECTED.  The SARS-CoV-2 RNA is generally detectable in upper and lower respiratory specimens during the acute phase of infection. The lowest concentration  of SARS-CoV-2 viral copies this assay can detect is 250 copies / mL. A negative result does not preclude SARS-CoV-2 infection and should not be used as the sole basis for treatment or other patient management decisions.  A negative result may occur with improper specimen collection / handling, submission of specimen other than nasopharyngeal swab, presence of viral mutation(s) within the areas targeted by this assay, and inadequate number of viral copies (<250 copies / mL). A negative result must be combined with clinical observations, patient history, and epidemiological information.  Fact Sheet for Patients:   StrictlyIdeas.no  Fact Sheet for Healthcare Providers: BankingDealers.co.za  This test is not yet approved or  cleared by the [place] FDA and has been authorized for detection and/or diagnosis of SARS-CoV-2 by FDA under an Emergency Use Authorization (EUA).  This EUA will remain in effect (meaning this test can be used) for the duration of the COVID-19 declaration under Section 564(b)(1) of the Act, 21 U.S.C. section 360bbb-3(b)(1), unless the authorization is terminated or revoked sooner.  Performed at [facility], [place] [address], [place], [place] [address]   Culture, sputum-assessment     Status: None   Collection Time: 04/03/20  3:43 AM   Specimen: Sputum  Result Value Ref Range Status   Specimen Description SPUTUM  Final   Special Requests NONE  Final   Sputum evaluation   Final    Sputum specimen  not acceptable for testing.  Please recollect.   Gram Stain Report Called to,Read Back By and Verified With: Ames Coupe RN, AT [phone] 04/03/20 BY Rush Landmark Performed at [facility], [place] [address], [place], [place] [address]    Report Status 04/03/2020 FINAL  Final    Please note: You were cared for by a hospitalist during your hospital stay. Once you are discharged, your primary care physician will handle any further medical issues. Please note that NO REFILLS for any discharge medications will be authorized once you are discharged, as it is imperative that you return to your primary care physician (or establish a relationship with a primary care physician if you do not have one) for your post hospital discharge needs so that they can reassess your need for medications and monitor your lab values.  Signed: Marlowe Aschoff Kara Melching  [facility] 04/03/2020, 10:05 AM

## 2020-04-03 NOTE — H&P (Addendum)
History and Physical    Gilbert Holmes SNK:[id number] DOB: [birth year] DOA: 04/02/2020  PCP: [name], MD  Patient coming from: Home.  Chief Complaint: Shortness of breath.  HPI: Gilbert Holmes is a 69 y.o. male with history of hypertension, chronic kidney disease stage III with ongoing tobacco abuse presents to the ER with complaints of shortness of breath.  Patient states over the last 24 hours patient has been having increasing shortness of breath and has developed productive cough with white sputum.  Had gone to his primary care physician and had chest x-ray done which shows possible pneumonia and was referred to the ER.  Denies any chest pain fever chills nausea vomiting or diarrhea.  ED Course: In the ER patient was hypoxic requiring oxygen to maintain sats.  On exam patient has bilateral expiratory wheeze.  Chest x-ray does not show anything acute.  Labs are showing mildly elevated troponin but flat at around 76.  Hemoglobin 10.9 creatinine 3.2 EKG shows normal sinus rhythm with poor R wave progression.  Patient was given antibiotics nebulizer and steroids admitted for acute bronchitis.  Review of Systems: As per HPI, rest all negative.   Past Medical History:  Diagnosis Date  . Arthritis   . Cellulitis and abscess of lower extremity 11/2016  . CKD (chronic kidney disease) stage 3, GFR 30-59 ml/min   . Diabetes mellitus   . DOE (dyspnea on exertion) 11/2016  . Hyperlipidemia   . Hypertension     Past Surgical History:  Procedure Laterality Date  . ANKLE FRACTURE SURGERY       reports that he has been smoking cigarettes. He has a 45.00 pack-year smoking history. He has never used smokeless tobacco. He reports that he does not drink alcohol and does not use drugs.  Allergies  Allergen Reactions  . Naproxen Sodium Swelling    Face became swollen  . Yellow [place]et Venom [Bee Venom] Anaphylaxis, Hives and Rash    Family History  Problem Relation Age of Onset  . CAD  Brother        CABG in his 66's.   . Cancer Mother   . Heart disease Father   . Hypertension Father     Prior to Admission medications   Medication Sig Start Date End Date Taking? Authorizing Provider  amLODipine (NORVASC) 10 MG tablet Take 10 mg by mouth daily. 01/08/20  Yes [provider]  ascorbic acid (VITAMIN C) 500 MG tablet Take 500 mg by mouth every other day.   Yes [provider]  atorvastatin (LIPITOR) 20 MG tablet Take 20 mg by mouth daily. 02/23/20  Yes [provider]  Cholecalciferol 25 MCG (1000 UT) tablet Take 1,000 Units by mouth daily.   Yes [provider]  clonazePAM (KLONOPIN) 1 MG tablet Take 1 mg by mouth 2 (two) times daily as needed for anxiety.   Yes [provider]  ferrous sulfate 325 (65 FE) MG tablet Take 325 mg by mouth every other day.   Yes [provider]  Flaxseed, Linseed, (FLAX SEED OIL) 1300 MG CAPS Take 1,300 mg by mouth daily.   Yes [provider]  hydrALAZINE (APRESOLINE) 50 MG tablet Take 50 mg by mouth in the morning and at bedtime. 01/08/20  Yes [provider]  loratadine (CLARITIN) 10 MG tablet Take 10 mg by mouth daily.   Yes [provider]  metoprolol tartrate (LOPRESSOR) 100 MG tablet Take 100 mg by mouth 2 (two) times daily. 05/27/18  Yes [provider]  Multiple Vitamins-Minerals (ONE-A-DAY MENS 50+ ADVANTAGE) TABS Take 1 tablet by mouth daily.   Yes [provider]  PARoxetine (PAXIL) 20 MG tablet Take 20 mg by mouth daily.    Yes [provider]  amLODipine (NORVASC) 5 MG tablet Take 1 tablet (5 mg total) by mouth daily. Patient not taking: Reported on 04/02/2020 11/30/16   Mendel Corning, MD    Physical Exam: Constitutional: Moderately built and nourished. Vitals:   04/02/20 1830 04/02/20 2031 04/02/20 2230 04/03/20 0222  BP: (!) 163/67 (!) 149/68 (!) 107/34 (!) 121/60  Pulse: 58 58 82 63  Resp: 21 20 18 20   Temp:    98.5 F  (36.9 C)  TempSrc:      SpO2: 95% 95% 91% 94%  Weight:    89.4 kg  Height:    5\' 8"  (1.727 m)   Eyes: Anicteric no pallor. ENMT: No discharge from the ears eyes nose or mouth. Neck: No mass felt.  No neck rigidity.  No JVD appreciated. Respiratory: Bilateral expiratory wheezes no crepitations. Cardiovascular: S1-S2 heard. Abdomen: Soft nontender bowel sounds present. Musculoskeletal: No edema. Skin: No rash. Neurologic: Alert awake oriented to time place and person.  Moves all extremities. Psychiatric: Appears normal but normal affect.   Labs on Admission: I have personally reviewed following labs and imaging studies  CBC: Recent Labs  Lab 04/02/20 1413 04/03/20 0239  WBC 7.8 6.6  HGB 10.9* 10.9*  HCT 34.2* 35.0*  MCV 92.4 90.4  PLT 132* 846*   Basic Metabolic Panel: Recent Labs  Lab 04/02/20 1413 04/03/20 0239  NA 132* 136  K 4.3 4.7  CL 103 104  CO2 21* 20*  GLUCOSE 117* 166*  BUN 46* 49*  CREATININE 3.26* 3.45*  CALCIUM 8.3* 8.5*   GFR: Estimated Creatinine Clearance: 22 mL/min (A) (by C-G formula based on SCr of 3.45 mg/dL (H)). Liver Function Tests: No results for input(s): AST, ALT, ALKPHOS, BILITOT, PROT, ALBUMIN in the last 168 hours. No results for input(s): LIPASE, AMYLASE in the last 168 hours. No results for input(s): AMMONIA in the last 168 hours. Coagulation Profile: No results for input(s): INR, PROTIME in the last 168 hours. Cardiac Enzymes: No results for input(s): CKTOTAL, CKMB, CKMBINDEX, TROPONINI in the last 168 hours. BNP (last 3 results) No results for input(s): PROBNP in the last 8760 hours. HbA1C: No results for input(s): HGBA1C in the last 72 hours. CBG: No results for input(s): GLUCAP in the last 168 hours. Lipid Profile: No results for input(s): CHOL, HDL, LDLCALC, TRIG, CHOLHDL, LDLDIRECT in the last 72 hours. Thyroid Function Tests: No results for input(s): TSH, T4TOTAL, FREET4, T3FREE, THYROIDAB in the last 72  hours. Anemia Panel: No results for input(s): VITAMINB12, FOLATE, FERRITIN, TIBC, IRON, RETICCTPCT in the last 72 hours. Urine analysis:    Component Value Date/Time   COLORURINE YELLOW 11/25/2016 1447   APPEARANCEUR HAZY (A) 11/25/2016 1447   LABSPEC 1.015 11/25/2016 1447   PHURINE 5.0 11/25/2016 1447   GLUCOSEU NEGATIVE 11/25/2016 1447   HGBUR MODERATE (A) 11/25/2016 1447   BILIRUBINUR NEGATIVE 11/25/2016 1447   KETONESUR 5 (A) 11/25/2016 1447   PROTEINUR 100 (A) 11/25/2016 1447   UROBILINOGEN 0.2 12/25/2011 1850   NITRITE NEGATIVE 11/25/2016 1447   LEUKOCYTESUR NEGATIVE 11/25/2016 1447   Sepsis Labs: @LABRCNTIP (procalcitonin:4,lacticidven:4) ) Recent Results (from the past 240 hour(s))  SARS Coronavirus 2 by RT PCR (hospital order, performed in [facility] hospital lab) Nasopharyngeal Nasopharyngeal Swab  Status: None   Collection Time: 04/02/20  7:05 PM   Specimen: Nasopharyngeal Swab  Result Value Ref Range Status   SARS Coronavirus 2 NEGATIVE NEGATIVE Final    Comment: (NOTE) SARS-CoV-2 target nucleic acids are NOT DETECTED.  The SARS-CoV-2 RNA is generally detectable in upper and lower respiratory specimens during the acute phase of infection. The lowest concentration of SARS-CoV-2 viral copies this assay can detect is 250 copies / mL. A negative result does not preclude SARS-CoV-2 infection and should not be used as the sole basis for treatment or other patient management decisions.  A negative result may occur with improper specimen collection / handling, submission of specimen other than nasopharyngeal swab, presence of viral mutation(s) within the areas targeted by this assay, and inadequate number of viral copies (<250 copies / mL). A negative result must be combined with clinical observations, patient history, and epidemiological information.  Fact Sheet for Patients:   StrictlyIdeas.no  Fact Sheet for Healthcare  Providers: BankingDealers.co.za  This test is not yet approved or  cleared by the [place] FDA and has been authorized for detection and/or diagnosis of SARS-CoV-2 by FDA under an Emergency Use Authorization (EUA).  This EUA will remain in effect (meaning this test can be used) for the duration of the COVID-19 declaration under Section 564(b)(1) of the Act, 21 U.S.C. section 360bbb-3(b)(1), unless the authorization is terminated or revoked sooner.  Performed at [facility], [place] [address] [place], [place] [address]      Radiological Exams on Admission: DG Chest Portable 1 View  Result Date: 04/02/2020 CLINICAL DATA:  69 year old male with shortness of breath. EXAM: PORTABLE CHEST 1 VIEW COMPARISON:  Chest radiograph dated 04/02/2020. FINDINGS: Faint bibasilar densities, likely atelectasis. A small left pleural effusion is not excluded. No focal consolidation or pneumothorax. Mild cardiomegaly. Atherosclerotic calcification of the aorta. No acute osseous pathology. IMPRESSION: No focal consolidation. Electronically Signed   By: Anner Crete M.D.   On: 04/02/2020 19:18    EKG: Independently reviewed.  Sinus bradycardia heart rate around 50 bpm.  Poor R wave progression.  Assessment/Plan Active Problems:   HTN (hypertension)   Acute respiratory failure with hypoxia (HCC)   Acute respiratory failure (HCC)   CKD (chronic kidney disease), stage IV (HCC)   Anemia associated with chronic renal failure    1. Acute respiratory failure with hypoxia likely from acute bronchitis for which I have placed patient on IV steroids nebulizer Pulmicort and antibiotics.  Check sputum cultures.  Check urine for Legionella strep antigen. 2. Hypertension on metoprolol amlodipine and hydralazine.  Patient does have sinus bradycardia we will keep patient on holding parameters for metoprolol. 3. Hyperlipidemia on statins. 4. Chronic kidney disease stage IV creatinine is  at the baseline when compared to labs in Care Everywhere.  Usually follows with nephrologist in [facility]. 5. Anxiety on Klonopin and Paxil. 6. Anemia on iron supplements.  Likely from renal disease. 7. Tobacco abuse advised about quitting.  Patient states he is cutting on tobacco. 8. Elevated troponin but remains flat.  Will check 2D echo and D-dimer.   DVT prophylaxis: Lovenox. Code Status: Full code. Family Communication: Discussed with patient. Disposition Plan: Home. Consults called: None. Admission status: Observation.   Rise Patience MD [facility] Pager [phone].  If 7PM-7AM, please contact night-coverage [email or web address] Password [facility]  04/03/2020, 5:17 AM

## 2020-04-11 DIAGNOSIS — J410 Simple chronic bronchitis: Secondary | ICD-10-CM | POA: Insufficient documentation

## 2020-07-18 DIAGNOSIS — Z0000 Encounter for general adult medical examination without abnormal findings: Secondary | ICD-10-CM | POA: Insufficient documentation

## 2020-07-18 DIAGNOSIS — R600 Localized edema: Secondary | ICD-10-CM | POA: Insufficient documentation

## 2020-10-16 DIAGNOSIS — Z6831 Body mass index (BMI) 31.0-31.9, adult: Secondary | ICD-10-CM | POA: Insufficient documentation

## 2020-11-19 DIAGNOSIS — M899 Disorder of bone, unspecified: Secondary | ICD-10-CM | POA: Insufficient documentation

## 2021-03-05 DIAGNOSIS — E785 Hyperlipidemia, unspecified: Secondary | ICD-10-CM | POA: Insufficient documentation

## 2021-03-05 DIAGNOSIS — R0609 Other forms of dyspnea: Secondary | ICD-10-CM | POA: Insufficient documentation

## 2021-03-05 HISTORY — DX: Other forms of dyspnea: R06.09

## 2021-03-06 ENCOUNTER — Ambulatory Visit: Payer: Medicare Other | Admitting: Cardiology

## 2021-03-07 DEATH — deceased

## 2021-07-03 IMAGING — DX DG CHEST 1V PORT
1 series · 2 of 2 positions shown · non-contrast
Comparison: Chest radiograph dated 04/02/2020.

CLINICAL DATA: 69-year-old male with shortness of breath.

EXAM:
PORTABLE CHEST 1 VIEW

[Series 1: chest · 0.14mm/px · 2 of 2 slices shown]
[im 1/2]
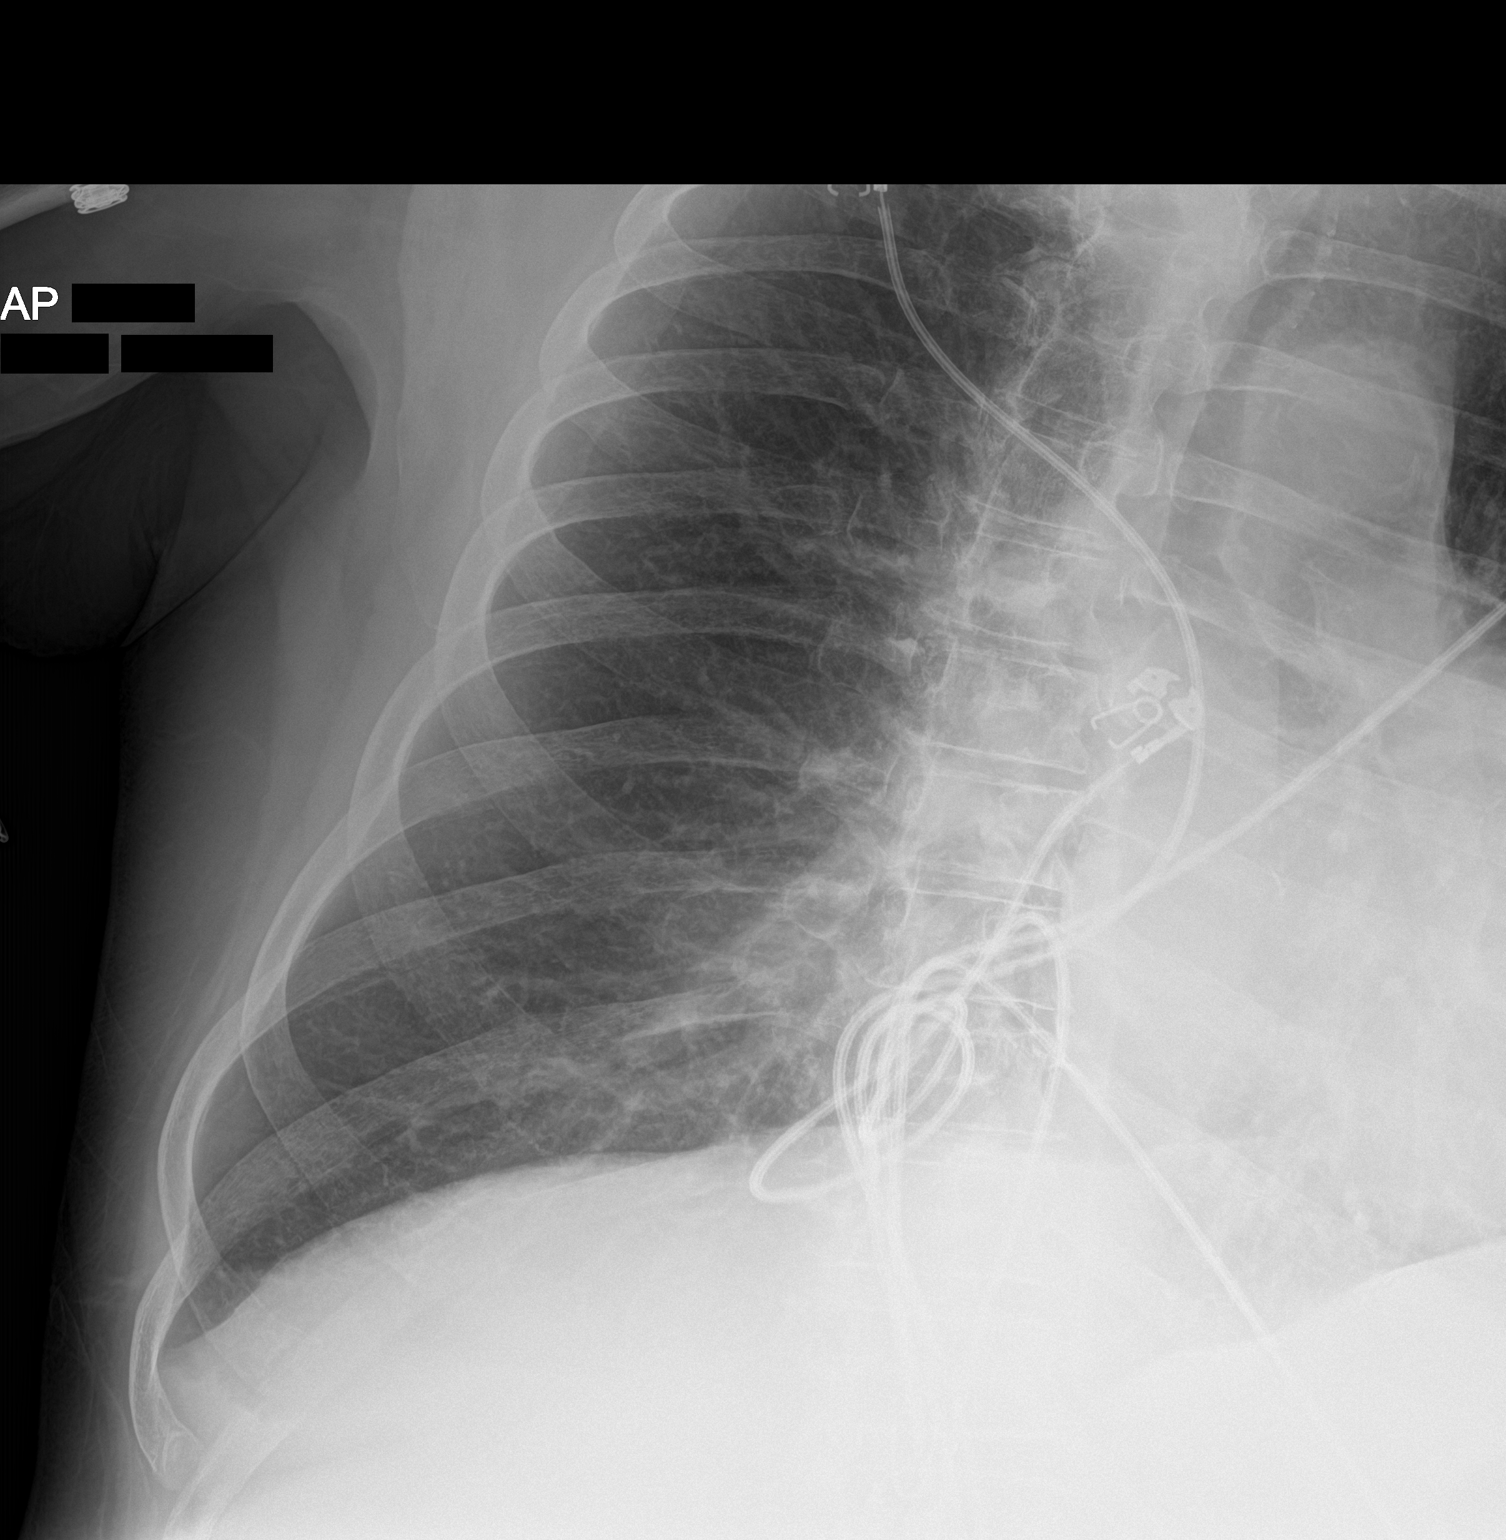
[im 2/2]
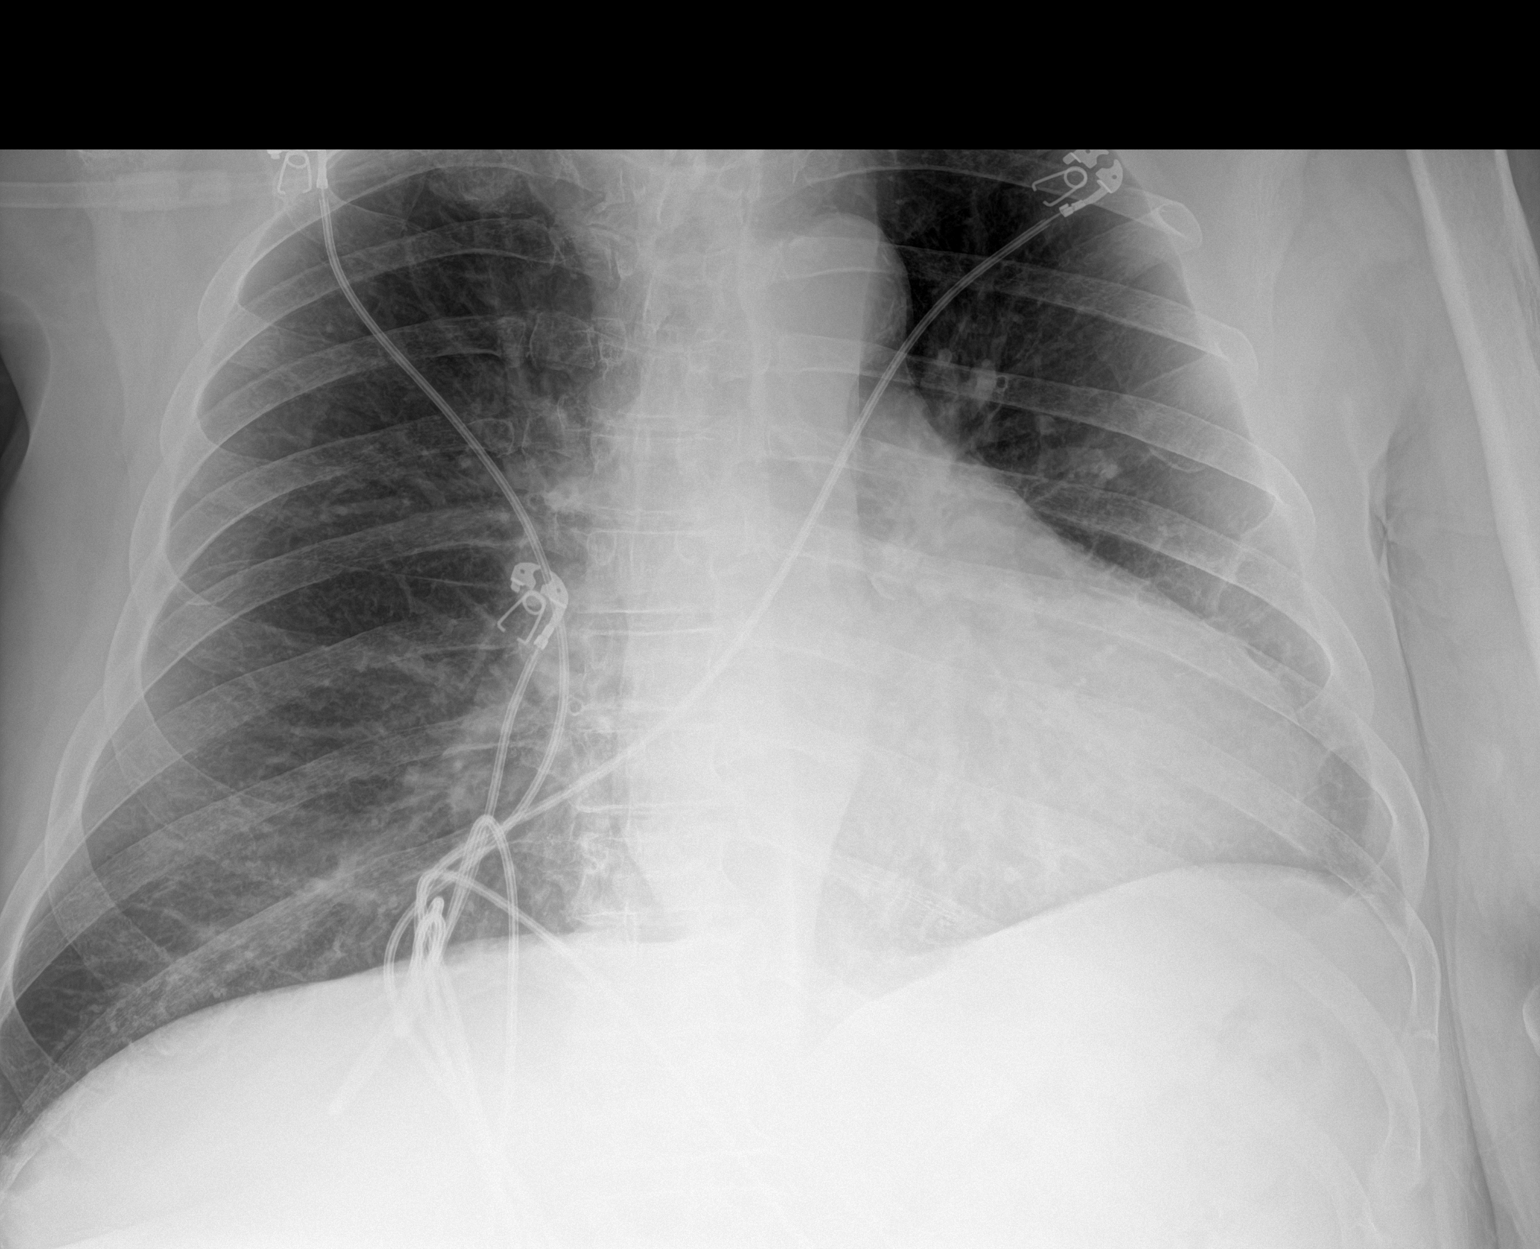

[2 of 2 positions shown; findings below may reference images not displayed]

FINDINGS: Faint bibasilar densities, likely atelectasis. A small left pleural
effusion is not excluded. No focal consolidation or pneumothorax.
Mild cardiomegaly. Atherosclerotic calcification of the aorta. No
acute osseous pathology.
IMPRESSION: No focal consolidation.
